# Patient Record
Sex: Female | Born: 1985 | Race: White | Hispanic: No | Marital: Married | Smoking: Never smoker
Health system: Southern US, Community
[De-identification: ages and names within clinical notes are randomized; demographics above are authoritative.]

## PROBLEM LIST (undated history)

## (undated) DIAGNOSIS — N029 Recurrent and persistent hematuria with unspecified morphologic changes: Secondary | ICD-10-CM

## (undated) DIAGNOSIS — M1990 Unspecified osteoarthritis, unspecified site: Secondary | ICD-10-CM

## (undated) DIAGNOSIS — K121 Other forms of stomatitis: Secondary | ICD-10-CM

## (undated) DIAGNOSIS — G4730 Sleep apnea, unspecified: Secondary | ICD-10-CM

## (undated) DIAGNOSIS — F419 Anxiety disorder, unspecified: Secondary | ICD-10-CM

## (undated) DIAGNOSIS — K602 Anal fissure, unspecified: Secondary | ICD-10-CM

## (undated) DIAGNOSIS — D649 Anemia, unspecified: Secondary | ICD-10-CM

## (undated) DIAGNOSIS — J45909 Unspecified asthma, uncomplicated: Secondary | ICD-10-CM

## (undated) DIAGNOSIS — K219 Gastro-esophageal reflux disease without esophagitis: Secondary | ICD-10-CM

## (undated) DIAGNOSIS — T7840XA Allergy, unspecified, initial encounter: Secondary | ICD-10-CM

## (undated) HISTORY — DX: Other forms of stomatitis: K12.1

## (undated) HISTORY — DX: Unspecified osteoarthritis, unspecified site: M19.90

## (undated) HISTORY — DX: Recurrent and persistent hematuria with unspecified morphologic changes: N02.9

## (undated) HISTORY — DX: Anal fissure, unspecified: K60.2

## (undated) HISTORY — DX: Gastro-esophageal reflux disease without esophagitis: K21.9

## (undated) HISTORY — DX: Unspecified asthma, uncomplicated: J45.909

## (undated) HISTORY — DX: Anxiety disorder, unspecified: F41.9

## (undated) HISTORY — DX: Anemia, unspecified: D64.9

## (undated) HISTORY — DX: Allergy, unspecified, initial encounter: T78.40XA

---
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
## 2006-07-01 ENCOUNTER — Other Ambulatory Visit: Admission: RE | Admit: 2006-07-01 | Discharge: 2006-07-01 | Payer: Self-pay | Admitting: Gynecology

## 2007-08-04 ENCOUNTER — Other Ambulatory Visit: Admission: RE | Admit: 2007-08-04 | Discharge: 2007-08-04 | Payer: Self-pay | Admitting: Gynecology

## 2011-04-06 ENCOUNTER — Encounter (INDEPENDENT_AMBULATORY_CARE_PROVIDER_SITE_OTHER): Payer: Self-pay | Admitting: Family Medicine

## 2011-04-06 DIAGNOSIS — R319 Hematuria, unspecified: Secondary | ICD-10-CM

## 2011-04-06 DIAGNOSIS — Z0000 Encounter for general adult medical examination without abnormal findings: Secondary | ICD-10-CM

## 2011-04-06 DIAGNOSIS — Z23 Encounter for immunization: Secondary | ICD-10-CM

## 2011-04-06 LAB — LIPID PANEL
Cholesterol: 195 mg/dL (ref 0–200)
HDL: 51 mg/dL (ref 35–70)
LDL Cholesterol: 126 mg/dL
Triglycerides: 92 mg/dL (ref 40–160)

## 2011-04-06 LAB — TSH: TSH: 1.72 u[IU]/mL (ref <=5.90)

## 2011-04-10 ENCOUNTER — Other Ambulatory Visit: Payer: Self-pay | Admitting: Family Medicine

## 2011-04-14 ENCOUNTER — Ambulatory Visit
Admission: RE | Admit: 2011-04-14 | Discharge: 2011-04-14 | Disposition: A | Discharge status: Home / Self Care | Payer: BC Managed Care – PPO | Source: Ambulatory Visit | Attending: Family Medicine | Admitting: Family Medicine

## 2011-06-04 ENCOUNTER — Telehealth: Payer: Self-pay | Admitting: Internal Medicine

## 2011-06-04 MED ORDER — ALPRAZOLAM 0.25 MG PO TABS
0.2500 mg | ORAL_TABLET | Freq: Two times a day (BID) | ORAL | Status: AC | PRN
Start: 2011-06-04 — End: 2011-07-04

## 2011-06-04 NOTE — Telephone Encounter (Signed)
RF

## 2011-06-08 ENCOUNTER — Ambulatory Visit (INDEPENDENT_AMBULATORY_CARE_PROVIDER_SITE_OTHER): Payer: BC Managed Care – PPO | Admitting: Family Medicine

## 2011-06-08 ENCOUNTER — Encounter: Payer: Self-pay | Admitting: Family Medicine

## 2011-06-08 VITALS — BP 118/75 | HR 76 | Temp 99.2°F | Resp 16 | Ht 59.0 in | Wt 148.4 lb

## 2011-06-08 DIAGNOSIS — F411 Generalized anxiety disorder: Secondary | ICD-10-CM

## 2011-06-08 DIAGNOSIS — F988 Other specified behavioral and emotional disorders with onset usually occurring in childhood and adolescence: Secondary | ICD-10-CM

## 2011-06-08 DIAGNOSIS — K121 Other forms of stomatitis: Secondary | ICD-10-CM | POA: Insufficient documentation

## 2011-06-08 DIAGNOSIS — Z658 Other specified problems related to psychosocial circumstances: Secondary | ICD-10-CM

## 2011-06-08 DIAGNOSIS — K602 Anal fissure, unspecified: Secondary | ICD-10-CM | POA: Insufficient documentation

## 2011-06-08 DIAGNOSIS — F419 Anxiety disorder, unspecified: Secondary | ICD-10-CM | POA: Insufficient documentation

## 2011-06-08 DIAGNOSIS — N029 Recurrent and persistent hematuria with unspecified morphologic changes: Secondary | ICD-10-CM | POA: Insufficient documentation

## 2011-06-08 MED ORDER — ESCITALOPRAM OXALATE 10 MG PO TABS: 10.0000 mg | ORAL_TABLET | Freq: Every day | ORAL | Status: DC

## 2011-06-08 MED ORDER — CLONAZEPAM 0.5 MG PO TABS: ORAL_TABLET | ORAL | Status: DC

## 2011-06-08 NOTE — Progress Notes (Signed)
  Subjective:    Patient ID: Donna Spencer, female    DOB: 04/27/1986, 26 y.o.   MRN: [id number]  HPI  Patient presents complaining of significant anxiety with the recent death of her grandfather. She has started a new semester at law and is planning her wedding which is scheduled for July.  FH of depression and anxiety(Father is on Lexapro and Wellbutrin)  Seeing counselor Thea Silversmith who referred patient for medication therapy. Patient scheduled an appointment with Dr. Evelene Croon but is unable to see her until March 17th.  Review of Systems     Objective:   Physical Exam  Constitutional: She appears well-developed and well-nourished.  Neurological: She is alert.  Psychiatric: She has a normal mood and affect.          Assessment & Plan:   1. GAD (generalized anxiety disorder)   2. Psychosocial stressors     Lexapro 10mg  daily Klonipin .5 mg 1/2-1 BID prn Xanax .25mg  prn panic attacks Keep appoinment with Dr. Evelene Croon Hospice refferal  Continue counseling with Selina Cooley

## 2011-06-09 ENCOUNTER — Telehealth: Payer: Self-pay

## 2011-06-09 NOTE — Telephone Encounter (Signed)
Pt CB and wants to double check with Dr Hal Hope that it OK for her to take the Lexapro Rxd at OV along with the phentermine she is already taking. She read in the Rx info sheet that the interaction might cause serotonin syndrome, and also, when she took it last night, she couldn't sleep and her heart was "beating really hard". She just wanted to make sure this is OK.

## 2011-06-09 NOTE — Telephone Encounter (Signed)
LMOM for patient to call back with concerns re: meds.

## 2011-06-09 NOTE — Telephone Encounter (Signed)
Pt is wondering if she is taking the right kind of combination of meds she is a little worried because of things she has heard would like a nurse to call her about this

## 2011-06-10 NOTE — Telephone Encounter (Signed)
Spoke with patient.  Monday evening patient was very anxious about starting the lexapro and felt the medication caused palpitations and insomnia. Patient was simultaneously taking Phentermine with the lexapro and I strongly encouraged patient against taking both.  Will see her back in clinic in 2 weeks.  Lots of reassurance provided.

## 2011-06-20 ENCOUNTER — Encounter: Payer: Self-pay | Admitting: Family Medicine

## 2011-06-22 ENCOUNTER — Ambulatory Visit (INDEPENDENT_AMBULATORY_CARE_PROVIDER_SITE_OTHER): Payer: BC Managed Care – PPO | Admitting: Family Medicine

## 2011-06-22 ENCOUNTER — Encounter: Payer: Self-pay | Admitting: Family Medicine

## 2011-06-22 DIAGNOSIS — F411 Generalized anxiety disorder: Secondary | ICD-10-CM

## 2011-06-22 DIAGNOSIS — N029 Recurrent and persistent hematuria with unspecified morphologic changes: Secondary | ICD-10-CM

## 2011-06-22 DIAGNOSIS — F419 Anxiety disorder, unspecified: Secondary | ICD-10-CM

## 2011-06-22 DIAGNOSIS — D239 Other benign neoplasm of skin, unspecified: Secondary | ICD-10-CM

## 2011-06-22 DIAGNOSIS — N938 Other specified abnormal uterine and vaginal bleeding: Secondary | ICD-10-CM

## 2011-06-22 DIAGNOSIS — K602 Anal fissure, unspecified: Secondary | ICD-10-CM

## 2011-06-22 NOTE — Progress Notes (Signed)
  Subjective:    Patient ID: Donna Spencer, female    DOB: 01-27-1986, 26 y.o.   MRN: [id number]  HPI Patient presents for follow up of anxiety. Tolerating Lexapro without side effects. Patient feels medication is beginning to help as she experienced a significant stressor over the weekend and handled it quite well.   Has only taken Xanax twice as she as she is anxious about taking too much medication.  Started menses after starting on Lexapro and wondered if it was secondary to the medication.  Rectal bleeding; history of fissure; continues to have symptoms; CBC, ESR and TSH are all normal.  Patient would like me to look at lesion on (L) LE; has not changed in size or color.   Review of Systems     Objective:   Physical Exam  Constitutional: She appears well-developed and well-nourished.  HENT:  Head: Normocephalic and atraumatic.  Cardiovascular: Normal rate, regular rhythm and normal heart sounds.   Pulmonary/Chest: Effort normal and breath sounds normal.  Neurological: She is alert.          Assessment & Plan:   1. Anxiety  Continue current medications; counseling provided  2. Anal fissure  Ambulatory referral to Gastroenterology  3. Benign hematuria  Follow  4. Dermatofibroma  reassurance  5. DUB (dysfunctional uterine bleeding)  Menstrual calendar

## 2011-06-29 ENCOUNTER — Encounter: Payer: Self-pay | Admitting: Gastroenterology

## 2011-07-09 ENCOUNTER — Ambulatory Visit: Payer: BC Managed Care – PPO | Admitting: Gastroenterology

## 2011-08-03 ENCOUNTER — Ambulatory Visit: Payer: BC Managed Care – PPO | Admitting: Family Medicine

## 2011-11-10 ENCOUNTER — Other Ambulatory Visit: Payer: Self-pay | Admitting: Family Medicine

## 2012-01-26 ENCOUNTER — Ambulatory Visit: Payer: Self-pay | Admitting: Physician Assistant

## 2012-01-26 VITALS — BP 97/65 | HR 95 | Temp 98.9°F | Resp 17 | Ht 58.5 in | Wt 144.0 lb

## 2012-01-26 DIAGNOSIS — N912 Amenorrhea, unspecified: Secondary | ICD-10-CM

## 2012-01-26 DIAGNOSIS — L03119 Cellulitis of unspecified part of limb: Secondary | ICD-10-CM

## 2012-01-26 DIAGNOSIS — L02419 Cutaneous abscess of limb, unspecified: Secondary | ICD-10-CM

## 2012-01-26 LAB — POCT URINE PREGNANCY: Preg Test, Ur: NEGATIVE

## 2012-01-26 MED ORDER — DOXYCYCLINE HYCLATE 100 MG PO CAPS: 100.0000 mg | ORAL_CAPSULE | Freq: Two times a day (BID) | ORAL | Status: DC

## 2012-01-26 MED ORDER — FLUCONAZOLE 150 MG PO TABS: 150.0000 mg | ORAL_TABLET | Freq: Once | ORAL | Status: DC

## 2012-01-26 NOTE — Progress Notes (Signed)
  Subjective:    Patient ID: Donna Spencer, female    DOB: June 15, 1985, 26 y.o.   MRN: [id number]  HPI L lower leg with abscess for about 1 month.  She has been putting neosporin on it.  She had a similar abscess on her R lower leg 1 year ago.  She never went to the doctor for it and she drained it herself.  No f/c.  No known bite. Only painful at times.   She also mentions having skipped her period.  She has taken home pregnancy tests that were negative, but she would like to take one here.   Review of Systems  All other systems reviewed and are negative.       Objective:   Physical Exam  Nursing note and vitals reviewed. Constitutional: She is oriented to person, place, and time. She appears well-developed and well-nourished.  HENT:  Head: Normocephalic and atraumatic.  Cardiovascular: Normal rate.   Pulmonary/Chest: Effort normal.  Neurological: She is alert and oriented to person, place, and time.  Skin:       Left lower leg ~ 7cm above medial malleolus 2X3 cm area of erythema, there is no induration.  There is a small area of central fluctuance ~3mm. No purulent drainage.   Results for orders placed in visit on 01/26/12  POCT URINE PREGNANCY      Component Value Range   Preg Test, Ur Negative        Assessment & Plan:  Mild cellulitis-I think this will get better with salt water soaks and the antibiotics.  D/c Neosporin.  Bandaged and ace wrapped. Amenorrhea-negative pregnancy test.

## 2012-03-10 ENCOUNTER — Ambulatory Visit (INDEPENDENT_AMBULATORY_CARE_PROVIDER_SITE_OTHER): Payer: BC Managed Care – PPO | Admitting: Emergency Medicine

## 2012-03-10 VITALS — BP 104/64 | HR 93 | Temp 98.0°F | Resp 16 | Ht 59.0 in | Wt 145.2 lb

## 2012-03-10 DIAGNOSIS — J209 Acute bronchitis, unspecified: Secondary | ICD-10-CM

## 2012-03-10 DIAGNOSIS — J40 Bronchitis, not specified as acute or chronic: Secondary | ICD-10-CM

## 2012-03-10 DIAGNOSIS — S81009A Unspecified open wound, unspecified knee, initial encounter: Secondary | ICD-10-CM

## 2012-03-10 DIAGNOSIS — L089 Local infection of the skin and subcutaneous tissue, unspecified: Secondary | ICD-10-CM

## 2012-03-10 DIAGNOSIS — R059 Cough, unspecified: Secondary | ICD-10-CM

## 2012-03-10 DIAGNOSIS — L97919 Non-pressure chronic ulcer of unspecified part of right lower leg with unspecified severity: Secondary | ICD-10-CM

## 2012-03-10 DIAGNOSIS — S81809A Unspecified open wound, unspecified lower leg, initial encounter: Secondary | ICD-10-CM

## 2012-03-10 DIAGNOSIS — R05 Cough: Secondary | ICD-10-CM

## 2012-03-10 MED ORDER — MUPIROCIN 2 % EX OINT: TOPICAL_OINTMENT | Freq: Three times a day (TID) | CUTANEOUS | Status: DC

## 2012-03-10 MED ORDER — DOXYCYCLINE HYCLATE 100 MG PO CAPS: 100.0000 mg | ORAL_CAPSULE | Freq: Two times a day (BID) | ORAL | Status: DC

## 2012-03-10 MED ORDER — BENZONATATE 100 MG PO CAPS: 100.0000 mg | ORAL_CAPSULE | Freq: Three times a day (TID) | ORAL | Status: DC | PRN

## 2012-03-10 NOTE — Progress Notes (Signed)
  Subjective:    Patient ID: Demetra Shiner, female    DOB: 1985-06-04, 26 y.o.   MRN: [id number]  HPI Pt is a 26 year old female here for cough with phlegm. Cough getting worse. She doesn't smoke, no allergies, but has some nasal congestion. She is also here for a left leg infection, which she was treated here and was given doxycycline about a month ago. She states it has came back and has busted with some drainage. She has been keeping the wound covered with a bandage but not having any pain.;    Review of Systems     Objective:   Physical Exam HEENT exam TMs are clear nose is congested but no true.. Throat is red. Neck is supple. Chest exam reveals no rhonchi no wheezes no rales.  Examination of left leg reveals a 3 x 4 mm ulcer which approximately a centimeter of surrounding bluish discolored skin. There is no purulent drainage      Assessment & Plan:  Patient has signs and symptoms of a bronchitis. We will treat with doxycycline 100 twice a day. I did a culture of the wound on her leg but I think this is more likely a resolving bout loose body. I did give her Bactroban ointment applied to the ulceration she has on her shin.

## 2012-03-10 NOTE — Patient Instructions (Addendum)

## 2012-03-13 LAB — WOUND CULTURE

## 2012-03-13 MED ORDER — CEPHALEXIN 500 MG PO CAPS: 500.0000 mg | ORAL_CAPSULE | Freq: Two times a day (BID) | ORAL | Status: DC

## 2012-03-13 NOTE — Addendum Note (Signed)
Addended by: Johnnette Litter on: 03/13/2012 01:34 PM   Modules accepted: Orders

## 2012-06-11 ENCOUNTER — Other Ambulatory Visit: Payer: Self-pay

## 2012-12-27 ENCOUNTER — Ambulatory Visit (HOSPITAL_COMMUNITY)
Admission: RE | Admit: 2012-12-27 | Discharge: 2012-12-27 | Disposition: A | Discharge status: Home / Self Care | Payer: BC Managed Care – PPO | Source: Ambulatory Visit | Attending: Orthopaedic Surgery | Admitting: Orthopaedic Surgery

## 2012-12-27 ENCOUNTER — Other Ambulatory Visit (HOSPITAL_COMMUNITY): Payer: Self-pay | Admitting: Orthopaedic Surgery

## 2012-12-27 DIAGNOSIS — M25561 Pain in right knee: Secondary | ICD-10-CM

## 2012-12-27 DIAGNOSIS — M79609 Pain in unspecified limb: Secondary | ICD-10-CM

## 2012-12-27 DIAGNOSIS — M7989 Other specified soft tissue disorders: Secondary | ICD-10-CM

## 2012-12-27 NOTE — Progress Notes (Signed)
*  Preliminary Results* Right lower extremity venous duplex completed. Right lower extremity is negative for deep vein thrombosis. There is no evidence of right Baker's cyst.  12/27/2012 3:13 PM  Gertie Fey, RVT, RDCS, RDMS

## 2013-03-02 ENCOUNTER — Other Ambulatory Visit: Payer: Self-pay

## 2014-01-26 ENCOUNTER — Ambulatory Visit (INDEPENDENT_AMBULATORY_CARE_PROVIDER_SITE_OTHER): Payer: Managed Care, Other (non HMO) | Admitting: Family Medicine

## 2014-01-26 ENCOUNTER — Encounter: Payer: Self-pay | Admitting: Family Medicine

## 2014-01-26 VITALS — BP 114/73 | HR 80 | Temp 98.1°F | Resp 16 | Ht 59.25 in | Wt 194.2 lb

## 2014-01-26 DIAGNOSIS — Z01419 Encounter for gynecological examination (general) (routine) without abnormal findings: Secondary | ICD-10-CM

## 2014-01-26 DIAGNOSIS — Z1321 Encounter for screening for nutritional disorder: Secondary | ICD-10-CM

## 2014-01-26 DIAGNOSIS — N898 Other specified noninflammatory disorders of vagina: Secondary | ICD-10-CM

## 2014-01-26 DIAGNOSIS — Z13220 Encounter for screening for lipoid disorders: Secondary | ICD-10-CM

## 2014-01-26 DIAGNOSIS — Z309 Encounter for contraceptive management, unspecified: Principal | ICD-10-CM

## 2014-01-26 DIAGNOSIS — E669 Obesity, unspecified: Secondary | ICD-10-CM

## 2014-01-26 DIAGNOSIS — Z3041 Encounter for surveillance of contraceptive pills: Secondary | ICD-10-CM | POA: Insufficient documentation

## 2014-01-26 DIAGNOSIS — Z139 Encounter for screening, unspecified: Secondary | ICD-10-CM

## 2014-01-26 DIAGNOSIS — Z0000 Encounter for general adult medical examination without abnormal findings: Secondary | ICD-10-CM

## 2014-01-26 LAB — POCT WET PREP WITH KOH
KOH Prep POC: NEGATIVE
Trichomonas, UA: NEGATIVE
Yeast Wet Prep HPF POC: NEGATIVE

## 2014-01-26 LAB — COMPLETE METABOLIC PANEL WITH GFR
ALBUMIN: 4 g/dL (ref 3.5–5.2)
ALT: 13 U/L (ref 0–35)
AST: 18 U/L (ref 0–37)
Alkaline Phosphatase: 57 U/L (ref 39–117)
BILIRUBIN TOTAL: 0.3 mg/dL (ref 0.2–1.2)
BUN: 8 mg/dL (ref 6–23)
CHLORIDE: 101 meq/L (ref 96–112)
CO2: 25 meq/L (ref 19–32)
Calcium: 9.2 mg/dL (ref 8.4–10.5)
Creat: 0.62 mg/dL (ref 0.50–1.10)
GFR, Est Non African American: >89 mL/min
Glucose, Bld: 71 mg/dL (ref 70–99)
POTASSIUM: 4.4 meq/L (ref 3.5–5.3)
SODIUM: 138 meq/L (ref 135–145)
TOTAL PROTEIN: 7.3 g/dL (ref 6.0–8.3)

## 2014-01-26 LAB — THYROID PANEL WITH TSH
Free Thyroxine Index: 2 (ref 1.4–3.8)
T3 UPTAKE: 22 % (ref 22.0–35.0)
T4, Total: 9 ug/dL (ref 4.5–12.0)
TSH: 1.592 u[IU]/mL (ref 0.350–4.500)

## 2014-01-26 LAB — LIPID PANEL
Cholesterol: 215 mg/dL — ABNORMAL HIGH (ref 0–200)
HDL: 69 mg/dL (ref >39)
LDL Cholesterol: 120 mg/dL — ABNORMAL HIGH (ref 0–99)
Total CHOL/HDL Ratio: 3.1 Ratio
Triglycerides: 131 mg/dL (ref <150)
VLDL: 26 mg/dL (ref 0–40)

## 2014-01-26 MED ORDER — NORGESTIM-ETH ESTRAD TRIPHASIC 0.18/0.215/0.25 MG-25 MCG PO TABS: 1.0000 | ORAL_TABLET | Freq: Every day | ORAL | Status: DC

## 2014-01-26 MED ORDER — METRONIDAZOLE 500 MG PO TABS: 500.0000 mg | ORAL_TABLET | Freq: Two times a day (BID) | ORAL | Status: DC

## 2014-01-26 NOTE — Progress Notes (Signed)
Subjective:    Patient ID: Donna Spencer, female    DOB: [birth year], 28 y.o.   MRN: [id number]  HPI  This 28 y.o. Cauc female is here for CPE/PAP. No recent PAP but last one was negative. She has concerns about weight gain; having gained ~ 50 lbs in last 12-15 months as result of stress eating while studying for the [employer]. No regular exercise.  She passed on 2nd attempt. Her mother is attorney Winona Legato.  Patient Active Problem List   Diagnosis Date Noted  . Obesity 01/26/2014  . Uses oral contraceptives 01/26/2014  . Anxiety   . Anal fissure   . Benign hematuria   . Mouth ulcers     Prior to Admission medications   Medication Sig Start Date End Date Taking? Authorizing Provider  amphetamine-dextroamphetamine (ADDERALL XR) 30 MG 24 hr capsule Take 30 mg by mouth every morning.   Yes Historical Provider, MD  desvenlafaxine (PRISTIQ) 100 MG 24 hr tablet Take 100 mg by mouth daily.   Yes Historical Provider, MD  Norgestimate-Ethinyl Estradiol Triphasic (ORTHO TRI-CYCLEN LO) 0.18/0.215/0.25 MG-25 MCG tab Take 1 tablet by mouth daily.   Yes Barton Fanny, MD  benzonatate (TESSALON) 100 MG capsule Take 1-2 capsules (100-200 mg total) by mouth 3 (three) times daily as needed for cough. 03/10/12   Darlyne Russian, MD  escitalopram (LEXAPRO) 20 MG tablet Take 30 mg by mouth daily. Pt taking 1 1/2 tablets qd    Historical Provider, MD  mupirocin ointment (BACTROBAN) 2 % Apply topically 3 (three) times daily. 03/10/12   Darlyne Russian, MD    History   Social History  . Marital Status: Single    Spouse Name: N/A    Number of Children: N/A  . Years of Education: N/A   Occupational History  . 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 Smoking status: Never Smoker   . Smokeless tobacco: Not on file  . Alcohol Use: No     Comment: occasionally - couple times/month (liquor)  . Drug Use: No  . Sexual Activity: Yes    Birth Control/ Protection: Pill   Other  Topics Concern  . Not on file   Social History Narrative  . No narrative on file    Family History  Problem Relation Age of Onset  . Hypertension Mother   . Cancer Maternal Grandmother   . GER disease Paternal Grandmother   . COPD Paternal Grandfather     Review of Systems  Constitutional: Positive for unexpected weight change. Negative for fatigue.  HENT: Negative.   Eyes: Negative.        Wears corrective lenses.  Respiratory: Negative.   Cardiovascular: Negative.   Gastrointestinal: Negative.   Endocrine: Negative.   Genitourinary: Negative.        "I don't really have regular periods". Occasional breast pain- consumes a lot of tea/caffeine.  Musculoskeletal: Negative.   Skin: Negative.   Allergic/Immunologic: Negative.   Neurological: Negative.   Hematological: Negative.   Psychiatric/Behavioral: Negative.        Objective:   Physical Exam  Nursing note and vitals reviewed. Constitutional: She is oriented to person, place, and time. Vital signs are normal. She appears well-developed and well-nourished.  HENT:  Head: Normocephalic and atraumatic.  Right Ear: Hearing, external ear and ear canal normal. Tympanic membrane is erythematous. Tympanic membrane is not injected and not retracted. No middle ear effusion.  Left Ear: Hearing, external ear  and ear canal normal. Tympanic membrane is erythematous. Tympanic membrane is not injected and not retracted.  No middle ear effusion.  Nose: Nose normal. No nasal deformity or septal deviation.  Mouth/Throat: Uvula is midline, oropharynx is clear and moist and mucous membranes are normal. No oral lesions. Normal dentition. No dental caries.  Eyes: Conjunctivae, EOM and lids are normal. Pupils are equal, round, and reactive to light. No scleral icterus.  Fundoscopic exam:      The right eye shows red reflex.       The left eye shows red reflex.  Neck: Trachea normal, normal range of motion, full passive range of motion  without pain and phonation normal. Neck supple. No spinous process tenderness and no muscular tenderness present. No mass and no thyromegaly present.  Cardiovascular: Normal rate, regular rhythm, S1 normal, S2 normal, normal heart sounds and normal pulses.  PMI is not displaced.   No murmur heard. Pulmonary/Chest: Effort normal and breath sounds normal. No respiratory distress. Right breast exhibits no inverted nipple, no mass, no nipple discharge, no skin change and no tenderness. Left breast exhibits no inverted nipple, no mass, no nipple discharge, no skin change and no tenderness. Breasts are symmetrical.  Abdominal: Soft. Normal appearance and bowel sounds are normal. She exhibits no distension and no mass. There is no hepatosplenomegaly. There is no tenderness. There is no guarding and no CVA tenderness.  Genitourinary: Rectum normal and uterus normal. There is no rash, tenderness or lesion on the right labia. There is no rash, tenderness or lesion on the left labia. Cervix exhibits discharge and friability. Cervix exhibits no motion tenderness. Right adnexum displays no mass, no tenderness and no fullness. Left adnexum displays no mass, no tenderness and no fullness. There is erythema around the vagina. No tenderness or bleeding around the vagina. Vaginal discharge found.  Musculoskeletal: Normal range of motion. She exhibits no edema and no tenderness.       Cervical back: Normal.       Thoracic back: Normal.       Lumbar back: Normal.  Lymphadenopathy:       Head (right side): No submental, no submandibular, no tonsillar, no preauricular, no posterior auricular and no occipital adenopathy present.       Head (left side): No submental, no submandibular, no tonsillar, no preauricular, no posterior auricular and no occipital adenopathy present.    She has no cervical adenopathy.    She has no axillary adenopathy.       Right: No inguinal and no supraclavicular adenopathy present.       Left: No  inguinal and no supraclavicular adenopathy present.  Neurological: She is alert and oriented to person, place, and time. She has normal strength and normal reflexes. She displays no atrophy. No cranial nerve deficit or sensory deficit. She exhibits normal muscle tone. Coordination and gait normal.  Skin: Skin is warm, dry and intact. No ecchymosis, no lesion, no petechiae and no rash noted. She is not diaphoretic. No cyanosis or erythema. No pallor. Nails show no clubbing.  Psychiatric: She has a normal mood and affect. Her speech is normal and behavior is normal. Judgment and thought content normal. Cognition and memory are normal.    Results for orders placed in visit on 01/26/14  POCT WET PREP WITH KOH      Result Value Ref Range   Trichomonas, UA Negative     Clue Cells Wet Prep HPF POC 7-14     Epithelial Wet Prep  HPF POC 7-14     Yeast Wet Prep HPF POC neg     Bacteria Wet Prep HPF POC 3+     RBC Wet Prep HPF POC 4-6     WBC Wet Prep HPF POC tntc     KOH Prep POC Negative         Assessment & Plan:  Normal physical examination, routine  Encounter for routine gynecological examination - Plan: Pap IG, CT/NG w/ reflex HPV when ASC-U  Encounter for vitamin deficiency screening - Plan: Vitamin D, 25-hydroxy  Vaginal discharge - Bacterial vaginosis > RX: Metronidazole 500 mg 1 tab bid.  Plan: POCT Wet Prep with KOH  Screening for hyperlipidemia - Plan: Lipid panel  Screening - Plan: CBC with diff  Obesity - Advised healthy nutrition and regular fitness routine. Pt will contact [facility] for nutritional guidance and weight management. Plan: COMPLETE METABOLIC PANEL WITH GFR, Vitamin D, 25-hydroxy, Thyroid Panel With TSH  Uses oral contraceptives- Refill OCPs  Meds ordered this encounter  Medications  . Norgestimate-Ethinyl Estradiol Triphasic (ORTHO TRI-CYCLEN LO) 0.18/0.215/0.25 MG-25 MCG tab    Sig: Take 1 tablet by mouth daily.    Dispense:  3 Package     Refill:  3  . metroNIDAZOLE (FLAGYL) 500 MG tablet    Sig: Take 1 tablet (500 mg total) by mouth 2 (two) times daily.    Dispense:  14 tablet    Refill:  0

## 2014-01-26 NOTE — Patient Instructions (Addendum)
Keeping You Healthy  Get These Tests 1. Blood Pressure- Have your blood pressure checked once a year by your health care provider.  Normal blood pressure is 120/80. 2. Weight- Have your body mass index (BMI) calculated to screen for obesity.  BMI is measure of body fat based on height and weight.  You can also calculate your own BMI at GravelBags.it. 3. Cholesterol- Have your cholesterol checked every 5 years starting at age 27 then yearly starting at age 59. 42. Chlamydia, HIV, and other sexually transmitted diseases- Get screened every year until age 65, then within three months of each new sexual provider. 5. Pap Smear- Every 1-3 years; discuss with your health care provider. 6. Mammogram- Every year starting at age 8  Take these medicines  Calcium with Vitamin D-Your body needs 1200 mg of Calcium each day and [phone] IU of Vitamin D daily.  Your body can only absorb 500 mg of Calcium at a time so Calcium must be taken in 2 or 3 divided doses throughout the day.  Multivitamin with folic acid- Once daily if it is possible for you to become pregnant.  Get these Immunizations  Menactra-Single dose; prevents meningitis.  Tetanus shot- Every 10 years.  Tdap- 04/06/2011.  Flu shot-Every year.  Take these steps 1. Do not smoke-Your healthcare provider can help you quit.  For tips on how to quit go to www.smokefree.gov or call 1-800 QUITNOW. 2. Be physically active- Exercise 5 days a week for at least 30 minutes.  If you are not already physically active, start slow and gradually work up to 30 minutes of moderate physical activity.  Examples of moderate activity include walking briskly, dancing, swimming, bicycling, etc. 3. Breast Cancer- A self breast exam every month is important for early detection of breast cancer.  For more information and instruction on self breast exams, ask your healthcare provider or https://www.patel.info/. 4. Eat a healthy diet-  Eat a variety of healthy foods such as fruits, vegetables, whole grains, low fat milk, low fat cheeses, yogurt, lean meats, poultry and fish, beans, nuts, tofu, etc.  For more information go to www. Thenutritionsource.org 5. Drink alcohol in moderation- Limit alcohol intake to one drink or less per day. Never drink and drive. 6. Depression- Your emotional health is as important as your physical health.  If you're feeling down or losing interest in things you normally enjoy please talk to your healthcare provider about being screened for depression. 7. Dental visit- Brush and floss your teeth twice daily; visit your dentist twice a year. 8. Eye doctor- Get an eye exam at least every 2 years. 9. Helmet use- Always wear a helmet when riding a bicycle, motorcycle, rollerblading or skateboarding. 16. Safe sex- If you may be exposed to sexually transmitted infections, use a condom. 11. Seat belts- Seat belts can save your live; always wear one. 12. Smoke/Carbon Monoxide detectors- These detectors need to be installed on the appropriate level of your home. Replace batteries at least once a year. 13. Skin cancer- When out in the sun please cover up and use sunscreen 15 SPF or higher. 14. Violence- If anyone is threatening or hurting you, please tell your healthcare provider.       Mediterranean Diet  Why follow it? Research shows.   Those who follow the Mediterranean diet have a reduced risk of heart disease    The diet is associated with a reduced incidence of Parkinson's and Alzheimer's diseases   People following the diet may have  longer life expectancies and lower rates of chronic diseases    The Dietary Guidelines for Americans recommends the Mediterranean diet as an eating plan to promote health and prevent disease  What Is the Mediterranean Diet?    Healthy eating plan based on typical foods and recipes of Mediterranean-style cooking   The diet is primarily a plant based diet; these foods  should make up a majority of meals   Starches - Plant based foods should make up a majority of meals - They are an important sources of vitamins, minerals, energy, antioxidants, and fiber - Choose whole grains, foods high in fiber and minimally processed items  - Typical grain sources include wheat, oats, barley, corn, brown rice, bulgar, farro, millet, polenta, couscous  - Various types of beans include chickpeas, lentils, fava beans, black beans, white beans   Fruits  Veggies - Large quantities of antioxidant rich fruits & veggies; 6 or more servings  - Vegetables can be eaten raw or lightly drizzled with oil and cooked  - Vegetables common to the traditional Mediterranean Diet include: artichokes, arugula, beets, broccoli, brussel sprouts, cabbage, carrots, celery, collard greens, cucumbers, eggplant, kale, leeks, lemons, lettuce, mushrooms, okra, onions, peas, peppers, potatoes, pumpkin, radishes, rutabaga, shallots, spinach, sweet potatoes, turnips, zucchini - Fruits common to the Mediterranean Diet include: apples, apricots, avocados, cherries, clementines, dates, figs, grapefruits, grapes, melons, nectarines, oranges, peaches, pears, pomegranates, strawberries, tangerines  Fats - Replace butter and margarine with healthy oils, such as olive oil, canola oil, and tahini  - Limit nuts to no more than a handful a day  - Nuts include walnuts, almonds, pecans, pistachios, pine nuts  - Limit or avoid candied, honey roasted or heavily salted nuts - Olives are central to the [employer] - can be eaten whole or used in a variety of dishes   Meats Protein - Limiting red meat: no more than a few times a month - When eating red meat: choose lean cuts and keep the portion to the size of deck of cards - Eggs: approx. 0 to 4 times a week  - Fish and lean poultry: at least 2 a week  - Healthy protein sources include, chicken, [place], lean beef, lamb - Increase intake of seafood such as tuna,  salmon, trout, mackerel, shrimp, scallops - Avoid or limit high fat processed meats such as sausage and bacon  Dairy - Include moderate amounts of low fat dairy products  - Focus on healthy dairy such as fat free yogurt, skim milk, low or reduced fat cheese - Limit dairy products higher in fat such as whole or 2% milk, cheese, ice cream  Alcohol - Moderate amounts of red wine is ok  - No more than 5 oz daily for women (all ages) and men older than age 14  - No more than 10 oz of wine daily for men younger than 70  Other - Limit sweets and other desserts  - Use herbs and spices instead of salt to flavor foods  - Herbs and spices common to the traditional Mediterranean Diet include: basil, bay leaves, chives, cloves, cumin, fennel, garlic, lavender, marjoram, mint, oregano, parsley, pepper, rosemary, sage, savory, sumac, tarragon, thyme   It's not just a diet, it's a lifestyle:    The Mediterranean diet includes lifestyle factors typical of those in the region    Foods, drinks and meals are best eaten with others and savored   Daily physical activity is important for overall good health   This  could be strenuous exercise like running and aerobics   This could also be more leisurely activities such as walking, housework, yard-work, or taking the stairs   Moderation is the key; a balanced and healthy diet accommodates most foods and drinks   Consider portion sizes and frequency of consumption of certain foods   Meal Ideas & Options:    Breakfast:  o Whole wheat toast or whole wheat English muffins with peanut butter & hard boiled egg o Steel cut oats topped with apples & cinnamon and skim milk  o Fresh fruit: banana, strawberries, melon, berries, peaches  o Smoothies: strawberries, bananas, greek yogurt, peanut butter o Low fat greek yogurt with blueberries and granola  o Egg white omelet with spinach and mushrooms o Breakfast couscous: whole wheat couscous, apricots, skim milk, cranberries     Sandwiches:  o Hummus and grilled vegetables (peppers, zucchini, squash) on whole wheat bread   o Grilled chicken on whole wheat pita with lettuce, tomatoes, cucumbers or tzatziki  o Tuna salad on whole wheat bread: tuna salad made with greek yogurt, olives, red peppers, capers, green onions o Garlic rosemary lamb pita: lamb sauted with garlic, rosemary, salt & pepper; add lettuce, cucumber, greek yogurt to pita - flavor with lemon juice and black pepper    Seafood:  o Mediterranean grilled salmon, seasoned with garlic, basil, parsley, lemon juice and black pepper o Shrimp, lemon, and spinach whole-grain pasta salad made with low fat greek yogurt  o Seared scallops with lemon orzo  o Seared tuna steaks seasoned salt, pepper, coriander topped with tomato mixture of olives, tomatoes, olive oil, minced garlic, parsley, green onions and cappers    Meats:  o Herbed greek chicken salad with kalamata olives, cucumber, feta  o Red bell peppers stuffed with spinach, bulgur, lean ground beef (or lentils) & topped with feta   o Kebabs: skewers of chicken, tomatoes, onions, zucchini, squash  o [place] burgers: made with red onions, mint, dill, lemon juice, feta cheese topped with roasted red peppers   Vegetarian o Cucumber salad: cucumbers, artichoke hearts, celery, red onion, feta cheese, tossed in olive oil & lemon juice  o Hummus and whole grain pita points with a greek salad (lettuce, tomato, feta, olives, cucumbers, red onion) o Lentil soup with celery, carrots made with vegetable broth, garlic, salt and pepper  o Tabouli salad: parsley, bulgur, mint, scallions, cucumbers, tomato, radishes, lemon juice, olive oil, salt and pepper. o     Try to find a physical activity that is fun so that you will commit to staying active.  Exercise to Lose Weight Exercise and a healthy diet may help you lose weight. Your doctor may suggest specific exercises. EXERCISE IDEAS AND TIPS  Choose low-cost things  you enjoy doing, such as walking, bicycling, or exercising to workout videos.  Take stairs instead of the elevator.  Walk during your lunch break.  Park your car further away from work or school.  Go to a gym or an exercise class.  Start with 5 to 10 minutes of exercise each day. Build up to 30 minutes of exercise 4 to 6 days a week.  Wear shoes with good support and comfortable clothes.  Stretch before and after working out.  Work out until you breathe harder and your heart beats faster.  Drink extra water when you exercise.  Do not do so much that you hurt yourself, feel dizzy, or get very short of breath. Exercises that burn about 150 calories:  Running 1  miles in 15 minutes.  Playing volleyball for 45 to 60 minutes.  Washing and waxing a car for 45 to 60 minutes.  Playing touch football for 45 minutes.  Walking 1  miles in 35 minutes.  Pushing a stroller 1  miles in 30 minutes.  Playing basketball for 30 minutes.  Raking leaves for 30 minutes.  Bicycling 5 miles in 30 minutes.  Walking 2 miles in 30 minutes.  Dancing for 30 minutes.  Shoveling snow for 15 minutes.  Swimming laps for 20 minutes.  Walking up stairs for 15 minutes.  Bicycling 4 miles in 15 minutes.  Gardening for 30 to 45 minutes.  Jumping rope for 15 minutes.  Washing windows or floors for 45 to 60 minutes. Document Released: 05/16/2010 Document Revised: 07/06/2011 Document Reviewed: 05/16/2010 [facility] Patient Information 2015 [place], [place]. This information is not intended to replace advice given to you by your health care provider. Make sure you discuss any questions you have with your health care provider.

## 2014-01-27 LAB — VITAMIN D 25 HYDROXY (VIT D DEFICIENCY, FRACTURES): Vit D, 25-Hydroxy: 29 ng/mL — ABNORMAL LOW (ref 30–89)

## 2014-01-29 LAB — PAP IG, CT-NG, RFX HPV ASCU
CHLAMYDIA PROBE AMP: NEGATIVE
GC Probe Amp: NEGATIVE

## 2014-01-30 ENCOUNTER — Telehealth: Payer: Self-pay | Admitting: *Deleted

## 2014-01-30 LAB — CBC WITH DIFFERENTIAL/PLATELET

## 2014-01-30 NOTE — Telephone Encounter (Signed)
[facility] called to notify us that they had an error in performing the CBC with Diff- they were not able to complete the test before the tube expired. Do you want pt to rtc to have CBC drawn again?

## 2014-01-30 NOTE — Telephone Encounter (Signed)
Pt does not need to return to clinic.

## 2014-01-30 NOTE — Telephone Encounter (Signed)
Donna Spencer

## 2014-02-02 ENCOUNTER — Telehealth: Payer: Self-pay

## 2014-02-02 ENCOUNTER — Encounter: Payer: Self-pay | Admitting: Family Medicine

## 2014-02-02 NOTE — Telephone Encounter (Signed)
The patient called regarding her medication for metroNIDAZOLE (FLAGYL) 500 MG tablet that was prescribed at her 02/01/14 appointment with Dr. Leward Quan.  She said that the medication made her very sick, and she wanted to discuss a possible alternative medication.  CB#: [phone]

## 2014-02-03 NOTE — Telephone Encounter (Signed)
Pt is done with the Flagyl. Donna Spencer it gave her N/V and fever. Wanted me to add this to her allergy list. I did.

## 2014-08-21 ENCOUNTER — Ambulatory Visit (INDEPENDENT_AMBULATORY_CARE_PROVIDER_SITE_OTHER): Payer: BLUE CROSS/BLUE SHIELD | Admitting: Emergency Medicine

## 2014-08-21 ENCOUNTER — Encounter: Payer: Self-pay | Admitting: Physician Assistant

## 2014-08-21 VITALS — BP 112/77 | HR 91 | Temp 98.5°F | Resp 16 | Ht 59.0 in | Wt 203.0 lb

## 2014-08-21 DIAGNOSIS — R05 Cough: Secondary | ICD-10-CM

## 2014-08-21 DIAGNOSIS — R1110 Vomiting, unspecified: Secondary | ICD-10-CM | POA: Diagnosis not present

## 2014-08-21 DIAGNOSIS — N912 Amenorrhea, unspecified: Secondary | ICD-10-CM

## 2014-08-21 DIAGNOSIS — R059 Cough, unspecified: Secondary | ICD-10-CM

## 2014-08-21 LAB — CBC WITH DIFFERENTIAL/PLATELET
Basophils Absolute: 0 10*3/uL (ref 0.0–0.1)
Basophils Relative: 0 % (ref 0–1)
EOS ABS: 0.3 10*3/uL (ref 0.0–0.7)
EOS PCT: 2 % (ref 0–5)
HEMATOCRIT: 39.6 % (ref 36.0–46.0)
HEMOGLOBIN: 13 g/dL (ref 12.0–15.0)
Lymphocytes Relative: 28 % (ref 12–46)
Lymphs Abs: 3.8 10*3/uL (ref 0.7–4.0)
MCH: 29.3 pg (ref 26.0–34.0)
MCHC: 32.8 g/dL (ref 30.0–36.0)
MCV: 89.4 fL (ref 78.0–100.0)
MONO ABS: 1 10*3/uL (ref 0.1–1.0)
MPV: 9 fL (ref 8.6–12.4)
Monocytes Relative: 7 % (ref 3–12)
NEUTROS PCT: 63 % (ref 43–77)
Neutro Abs: 8.6 10*3/uL — ABNORMAL HIGH (ref 1.7–7.7)
PLATELETS: 509 10*3/uL — AB (ref 150–400)
RBC: 4.43 MIL/uL (ref 3.87–5.11)
RDW: 13.6 % (ref 11.5–15.5)
WBC: 13.6 10*3/uL — ABNORMAL HIGH (ref 4.0–10.5)

## 2014-08-21 LAB — HCG, SERUM, QUALITATIVE: PREG SERUM: NEGATIVE

## 2014-08-21 LAB — POCT URINE PREGNANCY: Preg Test, Ur: NEGATIVE

## 2014-08-21 MED ORDER — BENZONATATE 100 MG PO CAPS: 100.0000 mg | ORAL_CAPSULE | Freq: Three times a day (TID) | ORAL | Status: DC | PRN

## 2014-08-21 MED ORDER — OMEPRAZOLE 40 MG PO CPDR: 40.0000 mg | DELAYED_RELEASE_CAPSULE | Freq: Every day | ORAL | Status: DC

## 2014-08-21 NOTE — Progress Notes (Addendum)
Subjective:    Patient ID: Donna Spencer, female    DOB: 1985-05-23, 29 y.o.   MRN: [id number] This chart was scribed for Arlyss Queen, MD by Donna Spencer, Medical Scribe. This patient was seen in room 27 and the patient's care was started at 3:27 PM.   HPI HPI Comments: Donna Spencer is a 29 y.o. female who presents to the [facility] complaining of gradual onset dry cough that started 2 months ago. The cough is throughout the day, but worse at night after around 5:00 PM. The coughing is not worsened by laying down. She notes 5 episodes of vomiting last night. Patient notes she did have some heartburn about a month and a half ago as well sharp chest pain for a brief period last month. She notes that her symptoms started with the flu and an infection to her inner ear. She had been put on an inhaler for her symptoms, but she notes that the inhaler makes her sick. Patient denies ear pain and wheezing. She also denies hx of asthma. She used a service called [facility] which allows her to be seen virtually by a physician on her phone. She has not had a CXR done.  Patient notes that she has not had a menstrual period since her recent illness started 2 months ago. Her last menstrual period was 2 months ago. She had been on birth control pills since the age of 24, but she recently stopped taking them 4 months ago. She has had recent pregnancy tests which have turned out negative. Patient is married and sexually active; she uses condoms every time for birth control.  Review of Systems  HENT: Negative for ear pain.   Respiratory: Positive for cough. Negative for wheezing.   Cardiovascular: Negative for chest pain.  Gastrointestinal: Positive for vomiting.  Genitourinary: Positive for menstrual problem.       Objective:   Physical Exam CONSTITUTIONAL: Well developed/well nourished HEAD: Normocephalic/atraumatic EYES: EOM/PERRL ENMT: Mucous membranes moist. Throat is  normal. Fluid behind right TM. NECK: supple no meningeal signs SPINE: entire spine nontender CV: S1/S2 noted, no murmurs/rubs/gallops noted. Regular rate and rhythm. LUNGS: Lungs are clear to auscultation bilaterally, no apparent distress ABDOMEN: soft, nontender, no rebound or guarding GU: no cva tenderness NEURO: Pt is awake/alert, moves all extremitiesx4 EXTREMITIES: pulses normal, full ROM SKIN: warm, color normal PSYCH: no abnormalities of mood noted Results for orders placed or performed in visit on 08/21/14  POCT urine pregnancy  Result Value Ref Range   Preg Test, Ur Negative    Results for orders placed or performed in visit on 08/21/14  CBC with Differential/Platelet  Result Value Ref Range   WBC 13.6 (H) 4.0 - 10.5 K/uL   RBC 4.43 3.87 - 5.11 MIL/uL   Hemoglobin 13.0 12.0 - 15.0 g/dL   HCT 39.6 36.0 - 46.0 %   MCV 89.4 78.0 - 100.0 fL   MCH 29.3 26.0 - 34.0 pg   MCHC 32.8 30.0 - 36.0 g/dL   RDW 13.6 11.5 - 15.5 %   Platelets 509 (H) 150 - 400 K/uL   MPV 9.0 8.6 - 12.4 fL   Neutrophils Relative % 63 43 - 77 %   Neutro Abs 8.6 (H) 1.7 - 7.7 K/uL   Lymphocytes Relative 28 12 - 46 %   Lymphs Abs 3.8 0.7 - 4.0 K/uL   Monocytes Relative 7 3 - 12 %   Monocytes Absolute 1.0 0.1 - 1.0 K/uL  Eosinophils Relative 2 0 - 5 %   Eosinophils Absolute 0.3 0.0 - 0.7 K/uL   Basophils Relative 0 0 - 1 %   Basophils Absolute 0.0 0.0 - 0.1 K/uL   Smear Review Criteria for review not met   TSH  Result Value Ref Range   TSH 1.593 0.350 - 4.500 uIU/mL  Prolactin  Result Value Ref Range   Prolactin 14.8 ng/mL  hCG, serum, qualitative  Result Value Ref Range   Preg, Serum NEG   POCT urine pregnancy  Result Value Ref Range   Preg Test, Ur Negative    [facility] reading (PRIMARY) by  Dr.Daub chest x-ray does not show any definite pneumonic infiltrates.     Assessment & Plan:   1. Cough  - CBC with Differential/Platelet - Spirometry with graph; spirometry shows signs of resected  lung disease. Will check a chest x-ray in the morning if pregnancy test is negative. She was given Prilosec to take at night along with [employer] White count is elevated chest x-ray shows no definite pneumonia we'll treat with doxycycline for 10 days.  2. Amenorrhea  - TSH - Prolactin - POCT urine pregnancy - hCG, serum, qualitative   I personally performed the services described in this documentation, which was scribed in my presence. The recorded information has been reviewed and is accurate.  Arlyss Queen, MD  [facility], [place] I personally performed the services described in this documentation, which was scribed in my presence. The recorded information has been reviewed and is accurate. 08/21/2014 5:45 PM

## 2014-08-21 NOTE — Patient Instructions (Signed)
Cough, Adult  A cough is a reflex that helps clear your throat and airways. It can help heal the body or may be a reaction to an irritated airway. A cough may only last 2 or 3 weeks (acute) or may last more than 8 weeks (chronic).  CAUSES Acute cough:  Viral or bacterial infections. Chronic cough:  Infections.  Allergies.  Asthma.  Post-nasal drip.  Smoking.  Heartburn or acid reflux.  Some medicines.  Chronic lung problems (COPD).  Cancer. SYMPTOMS   Cough.  Fever.  Chest pain.  Increased breathing rate.  High-pitched whistling sound when breathing (wheezing).  Colored mucus that you cough up (sputum). TREATMENT   A bacterial cough may be treated with antibiotic medicine.  A viral cough must run its course and will not respond to antibiotics.  Your caregiver may recommend other treatments if you have a chronic cough. HOME CARE INSTRUCTIONS   Only take over-the-counter or prescription medicines for pain, discomfort, or fever as directed by your caregiver. Use cough suppressants only as directed by your caregiver.  Use a cold steam vaporizer or humidifier in your bedroom or home to help loosen secretions.  Sleep in a semi-upright position if your cough is worse at night.  Rest as needed.  Stop smoking if you smoke. SEEK IMMEDIATE MEDICAL CARE IF:   You have pus in your sputum.  Your cough starts to worsen.  You cannot control your cough with suppressants and are losing sleep.  You begin coughing up blood.  You have difficulty breathing.  You develop pain which is getting worse or is uncontrolled with medicine.  You have a fever. MAKE SURE YOU:   Understand these instructions.  Will watch your condition.  Will get help right away if you are not doing well or get worse. Document Released: 10/10/2010 Document Revised: 07/06/2011 Document Reviewed: 10/10/2010 [employer] Patient Information 2015 [employer]. This information is not intended  to replace advice given to you by your health care provider. Make sure you discuss any questions you have with your health care provider.  

## 2014-08-22 ENCOUNTER — Other Ambulatory Visit: Payer: BLUE CROSS/BLUE SHIELD

## 2014-08-22 ENCOUNTER — Ambulatory Visit (INDEPENDENT_AMBULATORY_CARE_PROVIDER_SITE_OTHER): Payer: BLUE CROSS/BLUE SHIELD

## 2014-08-22 ENCOUNTER — Telehealth: Payer: Self-pay

## 2014-08-22 ENCOUNTER — Ambulatory Visit: Payer: BLUE CROSS/BLUE SHIELD

## 2014-08-22 DIAGNOSIS — R05 Cough: Secondary | ICD-10-CM

## 2014-08-22 DIAGNOSIS — R059 Cough, unspecified: Secondary | ICD-10-CM

## 2014-08-22 LAB — PROLACTIN: Prolactin: 14.8 ng/mL

## 2014-08-22 LAB — TSH: TSH: 1.593 u[IU]/mL (ref 0.350–4.500)

## 2014-08-22 MED ORDER — DOXYCYCLINE HYCLATE 100 MG PO TABS: 100.0000 mg | ORAL_TABLET | Freq: Two times a day (BID) | ORAL | Status: DC

## 2014-08-22 NOTE — Telephone Encounter (Signed)
Pt called about xray. Let her know it was normal and that we would call her back if you had any comments. Pt wants to know if she should take the abx that you sent in? And wants to know what her diagnosis is.

## 2014-08-22 NOTE — Telephone Encounter (Signed)
I suspect she has a bronchitis. Her chest x-ray did not show a pneumonia. I would advise her to go ahead and take the antibiotics for bronchitis

## 2014-08-22 NOTE — Addendum Note (Signed)
Addended by: Arlyss Queen A on: 08/22/2014 09:09 AM   Modules accepted: Orders

## 2014-08-23 NOTE — Telephone Encounter (Signed)
Left a detailed message letting pt know.

## 2014-09-01 ENCOUNTER — Encounter: Payer: Self-pay | Admitting: Emergency Medicine

## 2014-09-03 ENCOUNTER — Encounter: Payer: Self-pay | Admitting: Emergency Medicine

## 2014-09-03 NOTE — Telephone Encounter (Signed)
Dr Everlene Farrier asked me to call pt to make sure she gets the information to RTC that he sent her in the email. I called pt and got her VM. I left a message advising to read email, but then also gave her advise from Dr Everlene Farrier on VM.

## 2015-06-17 ENCOUNTER — Ambulatory Visit (INDEPENDENT_AMBULATORY_CARE_PROVIDER_SITE_OTHER): Payer: BLUE CROSS/BLUE SHIELD | Admitting: Family Medicine

## 2015-06-17 ENCOUNTER — Ambulatory Visit (INDEPENDENT_AMBULATORY_CARE_PROVIDER_SITE_OTHER): Payer: BLUE CROSS/BLUE SHIELD

## 2015-06-17 VITALS — BP 104/70 | HR 96 | Temp 98.5°F | Resp 18

## 2015-06-17 DIAGNOSIS — S99911A Unspecified injury of right ankle, initial encounter: Secondary | ICD-10-CM

## 2015-06-17 DIAGNOSIS — S93401A Sprain of unspecified ligament of right ankle, initial encounter: Secondary | ICD-10-CM

## 2015-06-17 DIAGNOSIS — S8251XS Displaced fracture of medial malleolus of right tibia, sequela: Secondary | ICD-10-CM

## 2015-06-17 NOTE — Patient Instructions (Addendum)
Because you received an x-ray today, you will receive an invoice from [facility]. Please contact [facility] at [phone] with questions or concerns regarding your invoice. Our billing staff will not be able to assist you with those questions.   Acute Ankle Sprain With Phase I Rehab An acute ankle sprain is a partial or complete tear in one or more of the ligaments of the ankle due to traumatic injury. The severity of the injury depends on both the number of ligaments sprained and the grade of sprain. There are 3 grades of sprains.   A grade 1 sprain is a mild sprain. There is a slight pull without obvious tearing. There is no loss of strength, and the muscle and ligament are the correct length.  A grade 2 sprain is a moderate sprain. There is tearing of fibers within the substance of the ligament where it connects two bones or two cartilages. The length of the ligament is increased, and there is usually decreased strength.  A grade 3 sprain is a complete rupture of the ligament and is uncommon. In addition to the grade of sprain, there are three types of ankle sprains.  Lateral ankle sprains: This is a sprain of one or more of the three ligaments on the outer side (lateral) of the ankle. These are the most common sprains. Medial ankle sprains: There is one large triangular ligament of the inner side (medial) of the ankle that is susceptible to injury. Medial ankle sprains are less common. Syndesmosis, "high ankle," sprains: The syndesmosis is the ligament that connects the two bones of the lower leg. Syndesmosis sprains usually only occur with very severe ankle sprains. SYMPTOMS  Pain, tenderness, and swelling in the ankle, starting at the side of injury that may progress to the whole ankle and foot with time.  "Pop" or tearing sensation at the time of injury.  Bruising that may spread to the heel.  Impaired ability to walk soon after injury. CAUSES   Acute ankle  sprains are caused by trauma placed on the ankle that temporarily forces or pries the anklebone (talus) out of its normal socket.  Stretching or tearing of the ligaments that normally hold the joint in place (usually due to a twisting injury). RISK INCREASES WITH:  Previous ankle sprain.  Sports in which the foot may land awkwardly (i.e., basketball, volleyball, or soccer) or walking or running on uneven or rough surfaces.  Shoes with inadequate support to prevent sideways motion when stress occurs.  Poor strength and flexibility.  Poor balance skills.  Contact sports. PREVENTION   Warm up and stretch properly before activity.  Maintain physical fitness:  Ankle and leg flexibility, muscle strength, and endurance.  Cardiovascular fitness.  Balance training activities.  Use proper technique and have a coach correct improper technique.  Taping, protective strapping, bracing, or high-top tennis shoes may help prevent injury. Initially, tape is best; however, it loses most of its support function within 10 to 15 minutes.  Wear proper-fitted protective shoes (High-top shoes with taping or bracing is more effective than either alone).  Provide the ankle with support during sports and practice activities for 12 months following injury. PROGNOSIS   If treated properly, ankle sprains can be expected to recover completely; however, the length of recovery depends on the degree of injury.  A grade 1 sprain usually heals enough in 5 to 7 days to allow modified activity and requires an average of 6 weeks to heal completely.  A grade 2  sprain requires 6 to 10 weeks to heal completely.  A grade 3 sprain requires 12 to 16 weeks to heal.  A syndesmosis sprain often takes more than 3 months to heal. RELATED COMPLICATIONS   Frequent recurrence of symptoms may result in a chronic problem. Appropriately addressing the problem the first time decreases the frequency of recurrence and optimizes  healing time. Severity of the initial sprain does not predict the likelihood of later instability.  Injury to other structures (bone, cartilage, or tendon).  A chronically unstable or arthritic ankle joint is a possibility with repeated sprains. TREATMENT Treatment initially involves the use of ice, medication, and compression bandages to help reduce pain and inflammation. Ankle sprains are usually immobilized in a walking cast or boot to allow for healing. Crutches may be recommended to reduce pressure on the injury. After immobilization, strengthening and stretching exercises may be necessary to regain strength and a full range of motion. Surgery is rarely needed to treat ankle sprains. MEDICATION   Nonsteroidal anti-inflammatory medications, such as aspirin and ibuprofen (do not take for the first 3 days after injury or within 7 days before surgery), or other minor pain relievers, such as acetaminophen, are often recommended. Take these as directed by your caregiver. Contact your caregiver immediately if any bleeding, stomach upset, or signs of an allergic reaction occur from these medications.  Ointments applied to the skin may be helpful.  Pain relievers may be prescribed as necessary by your caregiver. Do not take prescription pain medication for longer than 4 to 7 days. Use only as directed and only as much as you need. HEAT AND COLD  Cold treatment (icing) is used to relieve pain and reduce inflammation for acute and chronic cases. Cold should be applied for 10 to 15 minutes every 2 to 3 hours for inflammation and pain and immediately after any activity that aggravates your symptoms. Use ice packs or an ice massage.  Heat treatment may be used before performing stretching and strengthening activities prescribed by your caregiver. Use a heat pack or a warm soak. SEEK IMMEDIATE MEDICAL CARE IF:   Pain, swelling, or bruising worsens despite treatment.  You experience pain, numbness,  discoloration, or coldness in the foot or toes.  New, unexplained symptoms develop (drugs used in treatment may produce side effects.) EXERCISES  PHASE I EXERCISES RANGE OF MOTION (ROM) AND STRETCHING EXERCISES - Ankle Sprain, Acute Phase I, Weeks 1 to 2 These exercises may help you when beginning to restore flexibility in your ankle. You will likely work on these exercises for the 1 to 2 weeks after your injury. Once your physician, physical therapist, or athletic trainer sees adequate progress, he or she will advance your exercises. While completing these exercises, remember:   Restoring tissue flexibility helps normal motion to return to the joints. This allows healthier, less painful movement and activity.  An effective stretch should be held for at least 30 seconds.  A stretch should never be painful. You should only feel a gentle lengthening or release in the stretched tissue. RANGE OF MOTION - Dorsi/Plantar Flexion  While sitting with your right / left knee straight, draw the top of your foot upwards by flexing your ankle. Then reverse the motion, pointing your toes downward.  Hold each position for __________ seconds.  After completing your first set of exercises, repeat this exercise with your knee bent. Repeat __________ times. Complete this exercise __________ times per day.  RANGE OF MOTION - Ankle Alphabet  Imagine your  right / left big toe is a pen.  Keeping your hip and knee still, write out the entire alphabet with your "pen." Make the letters as large as you can without increasing any discomfort. Repeat __________ times. Complete this exercise __________ times per day.  STRENGTHENING EXERCISES - Ankle Sprain, Acute -Phase I, Weeks 1 to 2 These exercises may help you when beginning to restore strength in your ankle. You will likely work on these exercises for 1 to 2 weeks after your injury. Once your physician, physical therapist, or athletic trainer sees adequate  progress, he or she will advance your exercises. While completing these exercises, remember:   Muscles can gain both the endurance and the strength needed for everyday activities through controlled exercises.  Complete these exercises as instructed by your physician, physical therapist, or athletic trainer. Progress the resistance and repetitions only as guided.  You may experience muscle soreness or fatigue, but the pain or discomfort you are trying to eliminate should never worsen during these exercises. If this pain does worsen, stop and make certain you are following the directions exactly. If the pain is still present after adjustments, discontinue the exercise until you can discuss the trouble with your clinician. STRENGTH - Dorsiflexors  Secure a rubber exercise band/tubing to a fixed object (i.e., table, pole) and loop the other end around your right / left foot.  Sit on the floor facing the fixed object. The band/tubing should be slightly tense when your foot is relaxed.  Slowly draw your foot back toward you using your ankle and toes.  Hold this position for __________ seconds. Slowly release the tension in the band and return your foot to the starting position. Repeat __________ times. Complete this exercise __________ times per day.  STRENGTH - Plantar-flexors   Sit with your right / left leg extended. Holding onto both ends of a rubber exercise band/tubing, loop it around the ball of your foot. Keep a slight tension in the band.  Slowly push your toes away from you, pointing them downward.  Hold this position for __________ seconds. Return slowly, controlling the tension in the band/tubing. Repeat __________ times. Complete this exercise __________ times per day.  STRENGTH - Ankle Eversion  Secure one end of a rubber exercise band/tubing to a fixed object (table, pole). Loop the other end around your foot just before your toes.  Place your fists between your knees. This will  focus your strengthening at your ankle.  Drawing the band/tubing across your opposite foot, slowly, pull your little toe out and up. Make sure the band/tubing is positioned to resist the entire motion.  Hold this position for __________ seconds. Have your muscles resist the band/tubing as it slowly pulls your foot back to the starting position.  Repeat __________ times. Complete this exercise __________ times per day.  STRENGTH - Ankle Inversion  Secure one end of a rubber exercise band/tubing to a fixed object (table, pole). Loop the other end around your foot just before your toes.  Place your fists between your knees. This will focus your strengthening at your ankle.  Slowly, pull your big toe up and in, making sure the band/tubing is positioned to resist the entire motion.  Hold this position for __________ seconds.  Have your muscles resist the band/tubing as it slowly pulls your foot back to the starting position. Repeat __________ times. Complete this exercises __________ times per day.  STRENGTH - Towel Curls  Sit in a chair positioned on a non-carpeted surface.  Place your right / left foot on a towel, keeping your heel on the floor.  Pull the towel toward your heel by only curling your toes. Keep your heel on the floor.  If instructed by your physician, physical therapist, or athletic trainer, add weight to the end of the towel. Repeat __________ times. Complete this exercise __________ times per day.   This information is not intended to replace advice given to you by your health care provider. Make sure you discuss any questions you have with your health care provider.   Document Released: 11/12/2004 Document Revised: 05/04/2014 Document Reviewed: 07/26/2008 Elsevier Interactive Patient Education [employer].

## 2015-06-17 NOTE — Progress Notes (Signed)
Subjective:    Patient ID: Donna Spencer, female    DOB: [birth year], 30 y.o.   MRN: [id number] By signing my name below, I, Donna Spencer, attest that this documentation has been prepared under the direction and in the presence of Donna Cheadle, MD.  Electronically Signed: [name], Medical Scribe. 06/17/2015. 9:57 AM.  Chief Complaint  Patient presents with  . Ankle Injury    rolled, right, yesterday    HPI HPI Comments: Donna Spencer is a 30 y.o. female who presents to the [facility] complaining of sudden onset, right ankle pain secondary to rolling her ankle yesterday. She did have nausea immediately after the injury. Patient was able to ambulate after the injury. She has been icing and elevating the ankle and also took 2 ibuprofen yesterday. She had a surgery in the right foot as a child due to a torn ligament. Patient reports her ligamentous injury was over 15 years ago and healed with ORIF, but then screw was removed. She was seen by Dr. Noemi Spencer for this.  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  Past Medical History  Diagnosis Date  . Anxiety   . Anal fissure   . Benign hematuria   . Mouth ulcers     recurrent, apthous  . Allergy   . Anemia    Current Outpatient Prescriptions on File Prior to Visit  Medication Sig Dispense Refill  . amphetamine-dextroamphetamine (ADDERALL XR) 30 MG 24 hr capsule Take 30 mg by mouth every morning.     No current facility-administered medications on file prior to visit.   Allergies  Allergen Reactions  . Clarithromycin   . Codeine   . Dairy Aid [Lactase] Swelling    Avoid dairy all dairy products  . Flagyl [Metronidazole] Nausea And Vomiting and Other (See Comments)    Fever  . Tamiflu      Review of Systems  Constitutional: Positive for activity change. Negative for fever, chills and appetite  change.  Cardiovascular: Negative for leg swelling.  Musculoskeletal: Positive for joint swelling, arthralgias and gait problem. Negative for myalgias and back pain.  Skin: Positive for color change. Negative for pallor, rash and wound.  Neurological: Positive for weakness. Negative for numbness.  Hematological: Does not bruise/bleed easily.       Objective:  BP 104/70 mmHg  Pulse 96  Temp(Src) 98.5 F (36.9 C)  Resp 18  SpO2 98%  LMP 05/27/2015 (Approximate)  Physical Exam  Constitutional: She is oriented to person, place, and time. She appears well-developed and well-nourished. No distress.  HENT:  Head: Normocephalic and atraumatic.  Mouth/Throat: Oropharynx is clear and moist. No oropharyngeal exudate.  Eyes: Pupils are equal, round, and reactive to light.  Neck: Neck supple.  Cardiovascular: Normal rate.   Pulses:      Dorsalis pedis pulses are 2+ on the right side, and 2+ on the left side.       Posterior tibial pulses are 2+ on the right side, and 2+ on the left side.  Pulmonary/Chest: Effort normal.  Musculoskeletal: She exhibits tenderness. She exhibits no edema.  No tenderness over CF ligament or with squeeze test. TTP directly over lateral malleolus with moderate effusion and some mild bruising over posterior aspect. No tenderness surrounding lateral malleolus or proximal 5th metatarsal. TTP to medial malleolus and directly distal to that.  Neurological: She is  alert and oriented to person, place, and time. No cranial nerve deficit.  Strength 5/5.  Skin: Skin is warm and dry. No rash noted.  Psychiatric: She has a normal mood and affect. Her behavior is normal.  Nursing note and vitals reviewed.  Dg Ankle Complete Right  06/17/2015  CLINICAL DATA:  Twisted ankle.  Initial evaluation. EXAM: RIGHT ANKLE - COMPLETE 3+ VIEW COMPARISON:  No prior. FINDINGS: Diffuse soft tissue swelling. Tiny bony densities noted adjacent to the medial malleolus. These may represent old  fracture fragments. Acute avulsion fracture cannot be entirely excluded. No other focal bony abnormalities identified. IMPRESSION: Tiny bony densities noted adjacent to the medial malleolus. These may represent old fracture fragments. Acute medial malleolar avulsion fracture cannot be entirely excluded. No other bony abnormality otherwise noted. Diffuse soft tissue swelling. Electronically Signed   By: Marcello Moores  Register   On: 06/17/2015 10:23          Assessment & Plan:  She does have a tall cam boot which she used on her left foot 2 years ago and was instructed to wear boot at all times and RICE until improved, then switch over to Swede-O brace. Since patient is having tenderness over site that shows bone fragments and fragments of unknown duration on XR, she will follow-up for this with her orthopedic surgeon who may be better able to differentiate if this is from a prior injury or new. 1. Ankle injury, right, initial encounter   2. Sprain of ankle, right, initial encounter   3. Medial malleolar fracture, right, sequela     Orders Placed This Encounter  Procedures  . DG Ankle Complete Right    Standing Status: Future     Number of Occurrences: 1     Standing Expiration Date: 06/16/2016    Order Specific Question:  Reason for Exam (SYMPTOM  OR DIAGNOSIS REQUIRED)    Answer:  tenderness over lateral malleolus, rolled ankle yest    Order Specific Question:  Is the patient pregnant?    Answer:  No    Order Specific Question:  Preferred imaging location?    Answer:  External  . Ambulatory referral to Orthopedic Surgery    Referral Priority:  Routine    Referral Type:  Surgical    Referral Reason:  Specialty Services Required    Requested Specialty:  Orthopedic Surgery    Number of Visits Requested:  1    I personally performed the services described in this documentation, which was scribed in my presence. The recorded information has been reviewed and considered, and addended by me as  needed.  Donna Cheadle, MD MPH

## 2015-11-19 ENCOUNTER — Encounter: Payer: Self-pay | Admitting: Physician Assistant

## 2015-11-26 ENCOUNTER — Encounter: Payer: Self-pay | Admitting: Physician Assistant

## 2015-11-26 ENCOUNTER — Ambulatory Visit (INDEPENDENT_AMBULATORY_CARE_PROVIDER_SITE_OTHER): Payer: BLUE CROSS/BLUE SHIELD | Admitting: Physician Assistant

## 2015-11-26 VITALS — BP 114/70 | HR 80 | Temp 98.2°F | Resp 18 | Ht 58.5 in | Wt 194.4 lb

## 2015-11-26 DIAGNOSIS — E669 Obesity, unspecified: Secondary | ICD-10-CM | POA: Diagnosis not present

## 2015-11-26 DIAGNOSIS — Z1389 Encounter for screening for other disorder: Secondary | ICD-10-CM

## 2015-11-26 DIAGNOSIS — Z0000 Encounter for general adult medical examination without abnormal findings: Secondary | ICD-10-CM | POA: Diagnosis not present

## 2015-11-26 DIAGNOSIS — N920 Excessive and frequent menstruation with regular cycle: Secondary | ICD-10-CM

## 2015-11-26 DIAGNOSIS — Z13220 Encounter for screening for lipoid disorders: Secondary | ICD-10-CM

## 2015-11-26 DIAGNOSIS — H5712 Ocular pain, left eye: Secondary | ICD-10-CM | POA: Diagnosis not present

## 2015-11-26 DIAGNOSIS — Z139 Encounter for screening, unspecified: Secondary | ICD-10-CM

## 2015-11-26 DIAGNOSIS — Z130 Encounter for screening for diseases of the blood and blood-forming organs and certain disorders involving the immune mechanism: Secondary | ICD-10-CM | POA: Diagnosis not present

## 2015-11-26 DIAGNOSIS — Z114 Encounter for screening for human immunodeficiency virus [HIV]: Secondary | ICD-10-CM

## 2015-11-26 LAB — POC MICROSCOPIC URINALYSIS (UMFC): Mucus: ABSENT

## 2015-11-26 LAB — CBC WITH DIFFERENTIAL/PLATELET
BASOS PCT: 0 %
Basophils Absolute: 0 cells/uL (ref 0–200)
EOS PCT: 2 %
Eosinophils Absolute: 208 cells/uL (ref 15–500)
HCT: 39.6 % (ref 35.0–45.0)
Hemoglobin: 13.1 g/dL (ref 11.7–15.5)
Lymphocytes Relative: 27 %
Lymphs Abs: 2808 cells/uL (ref 850–3900)
MCH: 29.4 pg (ref 27.0–33.0)
MCHC: 33.1 g/dL (ref 32.0–36.0)
MCV: 88.8 fL (ref 80.0–100.0)
MONOS PCT: 5 %
MPV: 8.9 fL (ref 7.5–12.5)
Monocytes Absolute: 520 cells/uL (ref 200–950)
NEUTROS ABS: 6864 {cells}/uL (ref 1500–7800)
Neutrophils Relative %: 66 %
PLATELETS: 537 10*3/uL — AB (ref 140–400)
RBC: 4.46 MIL/uL (ref 3.80–5.10)
RDW: 13.2 % (ref 11.0–15.0)
WBC: 10.4 10*3/uL (ref 3.8–10.8)

## 2015-11-26 LAB — POCT URINALYSIS DIP (MANUAL ENTRY)
BILIRUBIN UA: NEGATIVE
GLUCOSE UA: NEGATIVE
LEUKOCYTES UA: NEGATIVE
Nitrite, UA: NEGATIVE
PH UA: 5.5
Spec Grav, UA: 1.025
Urobilinogen, UA: 0.2

## 2015-11-26 LAB — COMPREHENSIVE METABOLIC PANEL
ALK PHOS: 59 U/L (ref 33–115)
ALT: 15 U/L (ref 6–29)
AST: 15 U/L (ref 10–30)
Albumin: 4.5 g/dL (ref 3.6–5.1)
BUN: 12 mg/dL (ref 7–25)
CHLORIDE: 102 mmol/L (ref 98–110)
CO2: 25 mmol/L (ref 20–31)
CREATININE: 0.78 mg/dL (ref 0.50–1.10)
Calcium: 9.5 mg/dL (ref 8.6–10.2)
GLUCOSE: 91 mg/dL (ref 65–99)
Potassium: 4.3 mmol/L (ref 3.5–5.3)
SODIUM: 136 mmol/L (ref 135–146)
TOTAL PROTEIN: 7.6 g/dL (ref 6.1–8.1)
Total Bilirubin: 0.4 mg/dL (ref 0.2–1.2)

## 2015-11-26 LAB — HIV ANTIBODY (ROUTINE TESTING W REFLEX): HIV: NONREACTIVE

## 2015-11-26 LAB — LIPID PANEL
CHOLESTEROL: 222 mg/dL — AB (ref 125–200)
HDL: 55 mg/dL (ref >=46)
LDL Cholesterol: 147 mg/dL — ABNORMAL HIGH (ref <130)
Total CHOL/HDL Ratio: 4 Ratio (ref <=5.0)
Triglycerides: 98 mg/dL (ref <150)
VLDL: 20 mg/dL (ref <30)

## 2015-11-26 LAB — TSH: TSH: 2.76 mIU/L

## 2015-11-26 MED ORDER — NORGESTIMATE-ETH ESTRADIOL 0.25-35 MG-MCG PO TABS: 1.0000 | ORAL_TABLET | Freq: Every day | ORAL | 4 refills | Status: DC

## 2015-11-26 NOTE — Patient Instructions (Addendum)
See your eye specialist if your eye is not improved int he next 24-48 hours. In the meantime, use lubricating drops (I like Systane Ultra) and wear sunglasses.    IF you received an x-ray today, you will receive an invoice from [facility]. Please contact [facility] at [phone] with questions or concerns regarding your invoice.   IF you received labwork today, you will receive an invoice from [employer]. Please contact [facility] at [phone] with questions or concerns regarding your invoice.   Our billing staff will not be able to assist you with questions regarding bills from these companies.  You will be contacted with the lab results as soon as they are available. The fastest way to get your results is to activate your My Chart account. Instructions are located on the last page of this paperwork. If you have not heard from [place] regarding the results in 2 weeks, please contact this office.    Keeping You Healthy  Get These Tests 1. Blood Pressure- Have your blood pressure checked once a year by your health care provider.  Normal blood pressure is 120/80. 2. Weight- Have your body mass index (BMI) calculated to screen for obesity.  BMI is measure of body fat based on height and weight.  You can also calculate your own BMI at GravelBags.it. 3. Cholesterol- Have your cholesterol checked every 5 years starting at age 38 then yearly starting at age 38. 38. Chlamydia, HIV, and other sexually transmitted diseases- Get screened every year until age 25, then within three months of each new sexual provider. 5. Pap Test - Every 1-5 years; discuss with your health care provider. 6. Mammogram- Every 1-2 years starting at age 48--50  Take these medicines  Calcium with Vitamin D-Your body needs 1200 mg of Calcium each day and [id number] IU of Vitamin D daily.  Your body can only absorb 500 mg of Calcium at a time so Calcium must be taken in 2 or  3 divided doses throughout the day.  Multivitamin with folic acid- Once daily if it is possible for you to become pregnant.  Get these Immunizations  Gardasil-Series of three doses; prevents HPV related illness such as genital warts and cervical cancer.  Menactra-Single dose; prevents meningitis.  Tetanus shot- Every 10 years.  Flu shot-Every year.  Take these steps 1. Do not smoke-Your healthcare provider can help you quit.  For tips on how to quit go to www.smokefree.gov or call 1-800 QUITNOW. 2. Be physically active- Exercise 5 days a week for at least 30 minutes.  If you are not already physically active, start slow and gradually work up to 30 minutes of moderate physical activity.  Examples of moderate activity include walking briskly, dancing, swimming, bicycling, etc. 3. Breast Cancer- A self breast exam every month is important for early detection of breast cancer.  For more information and instruction on self breast exams, ask your healthcare provider or https://www.patel.info/. 4. Eat a healthy diet- Eat a variety of healthy foods such as fruits, vegetables, whole grains, low fat milk, low fat cheeses, yogurt, lean meats, poultry and fish, beans, nuts, tofu, etc.  For more information go to www. Thenutritionsource.org 5. Drink alcohol in moderation- Limit alcohol intake to one drink or less per day. Never drink and drive. 6. Depression- Your emotional health is as important as your physical health.  If you're feeling down or losing interest in things you normally enjoy please talk to your healthcare provider about being screened for  depression. 7. Dental visit- Brush and floss your teeth twice daily; visit your dentist twice a year. 8. Eye doctor- Get an eye exam at least every 2 years. 9. Helmet use- Always wear a helmet when riding a bicycle, motorcycle, rollerblading or skateboarding. 74. Safe sex- If you may be exposed to sexually transmitted infections,  use a condom. 11. Seat belts- Seat belts can save your live; always wear one. 12. Smoke/Carbon Monoxide detectors- These detectors need to be installed on the appropriate level of your home. Replace batteries at least once a year. 13. Skin cancer- When out in the sun please cover up and use sunscreen 15 SPF or higher. 14. Violence- If anyone is threatening or hurting you, please tell your healthcare provider.

## 2015-11-26 NOTE — Progress Notes (Signed)
Subjective:    Patient ID: Donna Spencer, female    DOB: 05/11/1985, 30 y.o.   MRN: [id number]  HPI Donna Spencer presents for annual exam without PAP today. Doing well overall. Experiencing menorrhagia 1-2 days per cycle with severe pain associated. Would like to try OCPs until she can get in to see GYN and discuss IUD. Has taken OCPs in the past, but has been off them for about 1 year.  Experiencing pain in her LEFT eye. Felt like she may have gotten something in it last night and was rubbing the eye. Associated drainage, swelling, photophobia, foreign body sensation. Does not wear contact lenses.   Review of Systems  Constitutional: Negative for activity change, appetite change, fatigue and fever.  HENT: Positive for rhinorrhea. Negative for congestion, hearing loss, sore throat and tinnitus.   Eyes: Positive for photophobia, pain, discharge, redness and visual disturbance. Negative for itching.  Respiratory: Negative for cough and shortness of breath.   Cardiovascular: Negative for chest pain and palpitations.  Gastrointestinal: Negative for abdominal pain, constipation, diarrhea, nausea and vomiting.  Endocrine: Negative for polydipsia and polyuria.  Genitourinary: Positive for menstrual problem. Negative for difficulty urinating and pelvic pain.  Musculoskeletal: Negative.   Skin: Negative.   Neurological: Negative for dizziness, numbness and headaches.  Psychiatric/Behavioral: Negative for dysphoric mood, sleep disturbance and suicidal ideas. The patient is nervous/anxious.    Past Medical History:  Diagnosis Date  . Allergy   . Anal fissure   . Anemia   . Anxiety   . Benign hematuria   . Mouth ulcers    recurrent, apthous   Family History  Problem Relation Age of Onset  . Hypertension Mother   . Cancer Maternal Grandmother   . GER disease Paternal Grandmother   . COPD Paternal Grandfather    Social History   Social History  . Marital status: Married    Spouse  name: Iona Beard   . Number of children: N/A  . Years of education: Doctorate   Occupational History  . Attorney     Social History Main Topics  . Smoking status: Never Smoker  . Smokeless tobacco: Never Used  . Alcohol use No     Comment: occasionally - couple times/month (liquor)  . Drug use: No  . Sexual activity: Yes    Birth control/ protection: Condom     Comment: may want to consider pill again    Other Topics Concern  . Not on file   Social History Narrative  . No narrative on file     Current Outpatient Prescriptions:  .  Adapalene-Benzoyl Peroxide (EPIDUO) 0.1-2.5 % gel, Apply topically at bedtime., Disp: , Rfl:  .  amphetamine-dextroamphetamine (ADDERALL XR) 30 MG 24 hr capsule, Take 30 mg by mouth every morning., Disp: , Rfl:    Allergies  Allergen Reactions  . Clarithromycin   . Codeine   . Dairy Aid [Lactase] Swelling    Avoid dairy all dairy products  . Flagyl [Metronidazole] Nausea And Vomiting and Other (See Comments)    Fever  . Tamiflu       Objective:   Physical Exam  Constitutional: She is oriented to person, place, and time. She appears well-developed and well-nourished. She is cooperative. No distress.  Blood pressure 114/70, pulse 80, temperature 98.2 F (36.8 C), temperature source Oral, resp. rate 18, height 4' 10.5" (1.486 m), weight 194 lb 6.4 oz (88.2 kg), last menstrual period 10/30/2015, SpO2 97 %.  HENT:  Head: Normocephalic and atraumatic.  Right Ear: Hearing, tympanic membrane, external ear and ear canal normal.  Left Ear: Hearing, tympanic membrane, external ear and ear canal normal.  Nose: Nose normal.  Mouth/Throat: Uvula is midline, oropharynx is clear and moist and mucous membranes are normal.  Eyes: Pupils are equal, round, and reactive to light. Lids are everted and swept, no foreign bodies found. Right eye exhibits no discharge and no exudate. Left eye exhibits discharge. Left eye exhibits no exudate. No foreign body present in  the left eye. Right conjunctiva is not injected. Right conjunctiva has no hemorrhage. Left conjunctiva is injected. Left conjunctiva has no hemorrhage. No scleral icterus.  Wood's lamp exam of LEFT eye revealed no foreign bodies, scratch or abrasion.  Neck: Trachea normal and normal range of motion. Neck supple. No thyromegaly present.  Cardiovascular: Normal rate, regular rhythm, normal heart sounds and intact distal pulses.   Pulmonary/Chest: Effort normal and breath sounds normal. Right breast exhibits no inverted nipple, no mass, no nipple discharge, no skin change and no tenderness. Left breast exhibits no inverted nipple, no mass, no nipple discharge, no skin change and no tenderness. Breasts are symmetrical.  Abdominal: Soft. Normal appearance and bowel sounds are normal. There is no tenderness.  Musculoskeletal: Normal range of motion.       Cervical back: She exhibits no tenderness.       Thoracic back: She exhibits no tenderness.       Lumbar back: She exhibits no tenderness.  Lymphadenopathy:    She has no cervical adenopathy.  Neurological: She is alert and oriented to person, place, and time. She has normal strength. No sensory deficit.  Reflex Scores:      Patellar reflexes are 1+ on the right side and 1+ on the left side. Skin: Skin is warm, dry and intact.  Psychiatric: She has a normal mood and affect. Her speech is normal and behavior is normal.          Assessment & Plan:  1. Encounter for annual physical exam Age appropriate anticipatory guidance provided  2. Obesity - encouraged healthy eating and exercise - Comprehensive metabolic panel - Lipid panel - TSH  3. Screening for hyperlipidemia - Lipid panel  4. Screening for blood or protein in urine - POCT Microscopic Urinalysis ([facility]) - POCT urinalysis dipstick  5. Screening for deficiency anemia - CBC with Differential/Platelet  6. Screening for HIV (human immunodeficiency virus) - HIV antibody  7.  Menorrhagia with regular cycle - plans to see GYN in October, will decide then whether she would like an IUD or to continue with OCP - CBC with Differential/Platelet - norgestimate-ethinyl estradiol (ORTHO-CYCLEN,SPRINTEC,PREVIFEM) 0.25-35 MG-MCG tablet; Take 1 tablet by mouth daily.  Dispense: 3 Package; Refill: 4  8. Eye pain, left - Wood's lamp examination revealed no foreign body or abrasion - advised using rewetting drops to help with irritation - if not improved in 24-48 hrs, advised to go see eye doctor  Claiborne Billings Rayburn PA-S 11/26/15

## 2015-11-26 NOTE — Progress Notes (Signed)
Patient ID: Donna Spencer, female    DOB: 06-08-1985, 30 y.o.   MRN: [id number]  PCP: No primary care provider on file.  Chief Complaint  Patient presents with  . Annual Exam  . Eye Pain    Left eye x 1 day    Subjective:   HPI: Presents for annual exam.  Establishing with GYN this fall for possible IUD placement. Intends to have pelvic exam and cervical cancer screening then, but desires breast exam here today stating, "I'm paranoid." She is interested in re-starting COC to help reduce the heaviness of her menstrual flow until she can get in to see GYN.  In addition, she reports LEFT eye pain since yesterday evening after rubbing her eyes. She saw some flakes of mascara, and thinks that one perhaps got in her eye. She has light sensitivity, FB sensation. She does not have visual disturbance, but notes that the eye is watering a lot. The pain is 10/10, and she considered going to the ED over night, but decided to wait "since I was already coming here."    Patient Active Problem List   Diagnosis Date Noted  . Obesity 01/26/2014  . Uses oral contraceptives 01/26/2014  . Anxiety   . Anal fissure   . Benign hematuria   . Mouth ulcers     Past Medical History:  Diagnosis Date  . Allergy   . Anal fissure   . Anemia   . Anxiety   . Benign hematuria   . Mouth ulcers    recurrent, apthous     Prior to Admission medications   Medication Sig Start Date End Date Taking? Authorizing Provider  Adapalene-Benzoyl Peroxide (EPIDUO) 0.1-2.5 % gel Apply topically at bedtime.   Yes Historical Provider, MD  amphetamine-dextroamphetamine (ADDERALL XR) 30 MG 24 hr capsule Take 30 mg by mouth every morning.   Yes Historical Provider, MD    Allergies  Allergen Reactions  . Clarithromycin   . Codeine   . Dairy Aid [Lactase] Swelling    Avoid dairy all dairy products  . Flagyl [Metronidazole] Nausea And Vomiting and Other (See Comments)    Fever  . Tamiflu     Past Surgical  History:  Procedure Laterality Date  . REPAIR ANKLE LIGAMENT Right 2002    Family History  Problem Relation Age of Onset  . Hypertension Mother   . Cancer Maternal Grandmother   . GER disease Paternal Grandmother   . COPD Paternal Grandfather     Social History   Social History  . Marital status: Married    Spouse name: Iona Beard   . Number of children: N/A  . Years of education: Doctorate   Occupational History  . Attorney     Social History Main Topics  . Smoking status: Never Smoker  . Smokeless tobacco: Never Used  . Alcohol use No     Comment: occasionally - couple times/month (liquor)  . Drug use: No  . Sexual activity: Yes    Birth control/ protection: Condom     Comment: may want to consider pill again    Other Topics Concern  . None   Social History Narrative  . None       Review of Systems  Constitutional: Negative.   HENT: Negative.   Eyes: Positive for photophobia, pain and discharge (watery). Negative for redness, itching and visual disturbance.  Respiratory: Negative.   Cardiovascular: Negative.   Gastrointestinal: Negative.   Endocrine: Negative.   Genitourinary: Positive for menstrual problem (  very heavy periods). Negative for dysuria, frequency, hematuria (history of benign hematuria) and urgency.  Musculoskeletal: Negative.   Skin: Negative.   Allergic/Immunologic: Negative.   Neurological: Negative.   Hematological: Negative.   Psychiatric/Behavioral: Negative.         Objective:  Physical Exam  Constitutional: She is oriented to person, place, and time. Vital signs are normal. She appears well-developed and well-nourished. She is active and cooperative. No distress.  BP 114/70 (BP Location: Left Arm, Patient Position: Sitting, Cuff Size: Large)   Pulse 80   Temp 98.2 F (36.8 C) (Oral)   Resp 18   Ht 4' 10.5" (1.486 m)   Wt 194 lb 6.4 oz (88.2 kg)   LMP 10/30/2015 (LMP Unknown)   SpO2 97%   BMI 39.94 kg/m    HENT:  Head:  Normocephalic and atraumatic.  Right Ear: Hearing, tympanic membrane, external ear and ear canal normal. No foreign bodies.  Left Ear: Hearing, tympanic membrane, external ear and ear canal normal. No foreign bodies.  Nose: Nose normal.  Mouth/Throat: Uvula is midline, oropharynx is clear and moist and mucous membranes are normal. No oral lesions. Normal dentition. No dental abscesses or uvula swelling. No oropharyngeal exudate.  Eyes: Conjunctivae and EOM are normal. Pupils are equal, round, and reactive to light. Lids are everted and swept, no foreign bodies found. Right eye exhibits no chemosis, no discharge, no exudate and no hordeolum. No foreign body present in the right eye. Left eye exhibits discharge (clear, tearing). Left eye exhibits no chemosis, no exudate and no hordeolum. No foreign body present in the left eye. No scleral icterus.  Fundoscopic exam:      The right eye shows no arteriolar narrowing, no AV nicking, no exudate, no hemorrhage and no papilledema. The right eye shows red reflex.       The left eye shows no arteriolar narrowing, no AV nicking, no exudate, no hemorrhage and no papilledema. The left eye shows red reflex.  Slit lamp exam:      The left eye shows no corneal abrasion, no corneal flare, no corneal ulcer, no foreign body, no hyphema, no hypopyon, no fluorescein uptake and no anterior chamber bulge.  2 drops of proparacaine and fluorescein instilled in the LEFT eye. Following exam, eye irrigated copiously with saline.  Neck: Trachea normal, normal range of motion and full passive range of motion without pain. Neck supple. No spinous process tenderness and no muscular tenderness present. No thyroid mass and no thyromegaly present.  Cardiovascular: Normal rate, regular rhythm, normal heart sounds, intact distal pulses and normal pulses.   Pulmonary/Chest: Effort normal and breath sounds normal. Right breast exhibits no inverted nipple, no mass, no nipple discharge, no  skin change and no tenderness. Left breast exhibits no inverted nipple, no mass, no nipple discharge, no skin change and no tenderness. Breasts are symmetrical.  Musculoskeletal: She exhibits no edema or tenderness.       Cervical back: Normal.       Thoracic back: Normal.       Lumbar back: Normal.  Lymphadenopathy:       Head (right side): No tonsillar, no preauricular, no posterior auricular and no occipital adenopathy present.       Head (left side): No tonsillar, no preauricular, no posterior auricular and no occipital adenopathy present.    She has no cervical adenopathy.       Right: No supraclavicular adenopathy present.       Left: No supraclavicular adenopathy present.  Neurological: She is alert and oriented to person, place, and time. She has normal strength and normal reflexes. No cranial nerve deficit. She exhibits normal muscle tone. Coordination and gait normal.  Skin: Skin is warm, dry and intact. No rash noted. She is not diaphoretic. No cyanosis or erythema. Nails show no clubbing.  Psychiatric: She has a normal mood and affect. Her speech is normal and behavior is normal. Judgment and thought content normal.           Assessment & Plan:  1. Encounter for annual physical exam Age appropriate anticipatory guidance provided. Proceed with GYN visit this fall as planned.  2. Obesity Healthy eating, regular exercise. - Comprehensive metabolic panel - Lipid panel - TSH  3. Screening for hyperlipidemia - Lipid panel  4. Screening for blood or protein in urine - POCT Microscopic Urinalysis ([facility]) - POCT urinalysis dipstick  5. Screening for deficiency anemia - CBC with Differential/Platelet  6. Screening for HIV (human immunodeficiency virus) - HIV antibody  7. Menorrhagia with regular cycle Trial of ortho-cyclen.  - CBC with Differential/Platelet - norgestimate-ethinyl estradiol (ORTHO-CYCLEN,SPRINTEC,PREVIFEM) 0.25-35 MG-MCG tablet; Take 1 tablet by mouth  daily.  Dispense: 3 Package; Refill: 4  8. Eye pain, left No abrasion or FB found. Recommend sun protection and hydrating eye drops. If symptoms persist, she should contact her eye specialist.   Fara Chute, PA-C Physician Assistant-Certified [facility]

## 2015-11-28 ENCOUNTER — Encounter: Payer: Self-pay | Admitting: Physician Assistant

## 2016-02-07 DIAGNOSIS — N946 Dysmenorrhea, unspecified: Secondary | ICD-10-CM | POA: Insufficient documentation

## 2016-02-21 ENCOUNTER — Ambulatory Visit (INDEPENDENT_AMBULATORY_CARE_PROVIDER_SITE_OTHER): Payer: BLUE CROSS/BLUE SHIELD | Admitting: Physician Assistant

## 2016-02-21 VITALS — BP 110/74 | HR 84 | Temp 98.0°F | Resp 18 | Ht 58.5 in | Wt 185.6 lb

## 2016-02-21 DIAGNOSIS — J069 Acute upper respiratory infection, unspecified: Secondary | ICD-10-CM | POA: Diagnosis not present

## 2016-02-21 DIAGNOSIS — B9789 Other viral agents as the cause of diseases classified elsewhere: Secondary | ICD-10-CM

## 2016-02-21 DIAGNOSIS — J029 Acute pharyngitis, unspecified: Secondary | ICD-10-CM

## 2016-02-21 LAB — POCT RAPID STREP A (OFFICE): Rapid Strep A Screen: NEGATIVE

## 2016-02-21 MED ORDER — FLUTICASONE PROPIONATE 50 MCG/ACT NA SUSP: 2.0000 | Freq: Every day | NASAL | 6 refills | Status: DC

## 2016-02-21 MED ORDER — HYDROCODONE-HOMATROPINE 5-1.5 MG/5ML PO SYRP: 5.0000 mL | ORAL_SOLUTION | Freq: Three times a day (TID) | ORAL | 0 refills | Status: DC | PRN

## 2016-02-21 MED ORDER — ALBUTEROL SULFATE 108 (90 BASE) MCG/ACT IN AEPB: 2.0000 | INHALATION_SPRAY | RESPIRATORY_TRACT | 0 refills | Status: DC | PRN

## 2016-02-21 MED ORDER — GUAIFENESIN ER 1200 MG PO TB12: 1.0000 | ORAL_TABLET | Freq: Two times a day (BID) | ORAL | 0 refills | Status: AC

## 2016-02-21 NOTE — Progress Notes (Signed)
Donna Spencer  MRN: [id number] DOB: Feb 18, 1986  Subjective:  Pt presents to clinic with cold symptoms or the last 4 days - she started with a tickle in her throat and now she has a sore throat. Nasal congestion started today and the rhinorrhea is clear - she is coughing up some yellow mucus from her throat. She has been  Using cough drops and excedrin 2 days ago.  Just returned from a conference in [place].  Review of Systems  Constitutional: Positive for chills and fever.  HENT: Positive for congestion, postnasal drip, rhinorrhea and sore throat.   Respiratory: Positive for cough. Negative for shortness of breath and wheezing.        No h/o asthma, she has needed an inhaler in the past, nonsmoker  Psychiatric/Behavioral: Positive for sleep disturbance (cough and congestion).    Patient Active Problem List   Diagnosis Date Noted  . Obesity 01/26/2014  . Uses oral contraceptives 01/26/2014  . Anxiety   . Anal fissure   . Benign hematuria   . Mouth ulcers     Current Outpatient Prescriptions on File Prior to Visit  Medication Sig Dispense Refill  . Adapalene-Benzoyl Peroxide (EPIDUO) 0.1-2.5 % gel Apply topically at bedtime.    Marland Kitchen amphetamine-dextroamphetamine (ADDERALL XR) 30 MG 24 hr capsule Take 30 mg by mouth every morning.    . norgestimate-ethinyl estradiol (ORTHO-CYCLEN,SPRINTEC,PREVIFEM) 0.25-35 MG-MCG tablet Take 1 tablet by mouth daily. 3 Package 4   No current facility-administered medications on file prior to visit.     Allergies  Allergen Reactions  . Clarithromycin   . Codeine   . Dairy Aid [Lactase] Swelling    Avoid dairy all dairy products  . Flagyl [Metronidazole] Nausea And Vomiting and Other (See Comments)    Fever  . Tamiflu     Pt patients past, family and social history were reviewed and updated.  Objective:  BP 110/74 (BP Location: Right Arm, Patient Position: Sitting, Cuff Size: Small)   Pulse 84   Temp 98 F (36.7 C) (Oral)   Resp  18   Ht 4' 10.5" (1.486 m)   Wt 185 lb 9.6 oz (84.2 kg)   LMP 01/22/2016   SpO2 98%   BMI 38.13 kg/m   Physical Exam  Constitutional: She is oriented to person, place, and time and well-developed, well-nourished, and in no distress.  HENT:  Head: Normocephalic and atraumatic.  Right Ear: Hearing, external ear and ear canal normal. A middle ear effusion is present.  Left Ear: Hearing, tympanic membrane, external ear and ear canal normal.  No middle ear effusion.  Nose: Mucosal edema (red) present.  Mouth/Throat: Uvula is midline, oropharynx is clear and moist and mucous membranes are normal.  Eyes: Conjunctivae are normal.  Neck: Normal range of motion.  Cardiovascular: Normal rate, regular rhythm and normal heart sounds.   No murmur heard. Pulmonary/Chest: Effort normal and breath sounds normal.  Neurological: She is alert and oriented to person, place, and time. Gait normal.  Skin: Skin is warm and dry.  Psychiatric: Mood, memory, affect and judgment normal.  Vitals reviewed.  Results for orders placed or performed in visit on 02/21/16  POCT rapid strep A  Result Value Ref Range   Rapid Strep A Screen Negative Negative    Assessment and Plan :  Sore throat - Plan: POCT rapid strep A  Viral URI with cough - Plan: HYDROcodone-homatropine (HYCODAN) 5-1.5 MG/5ML syrup, fluticasone (FLONASE) 50 MCG/ACT nasal spray, Guaifenesin (MUCINEX MAXIMUM STRENGTH) 1200 MG  TB12, Albuterol Sulfate (PROAIR RESPICLICK) 123XX123 (90 Base) MCG/ACT AEPB   Symptomatic care d/w pt.  Windell Hummingbird PA-C  [facility] 02/21/2016 4:43 PM

## 2016-02-21 NOTE — Patient Instructions (Signed)
     IF you received an x-ray today, you will receive an invoice from [facility]. Please contact [facility] at [phone] with questions or concerns regarding your invoice.   IF you received labwork today, you will receive an invoice from [facility]. Please contact [facility] at [phone] with questions or concerns regarding your invoice.   Our billing staff will not be able to assist you with questions regarding bills from these companies.  You will be contacted with the lab results as soon as they are available. The fastest way to get your results is to activate your My Chart account. Instructions are located on the last page of this paperwork. If you have not heard from us regarding the results in 2 weeks, please contact this office.      

## 2016-04-01 NOTE — Progress Notes (Signed)
Erroneous epic generated encounter. Philis Fendt, MS, PA-C 9:46 AM, 04/01/2016

## 2016-04-07 ENCOUNTER — Telehealth: Payer: Self-pay

## 2016-04-07 NOTE — Telephone Encounter (Signed)
Fax req from [facility] re: is pt taking active tabs Yes or No- for insurance purposes.  LMOVM for pt to return call with answer.

## 2016-04-27 IMAGING — CR DG CHEST 2V
2 series · 2 of 2 positions shown · non-contrast
Comparison: None.

CLINICAL DATA: Cough and elevated white blood cell count.

EXAM:
CHEST  2 VIEW

[PA]
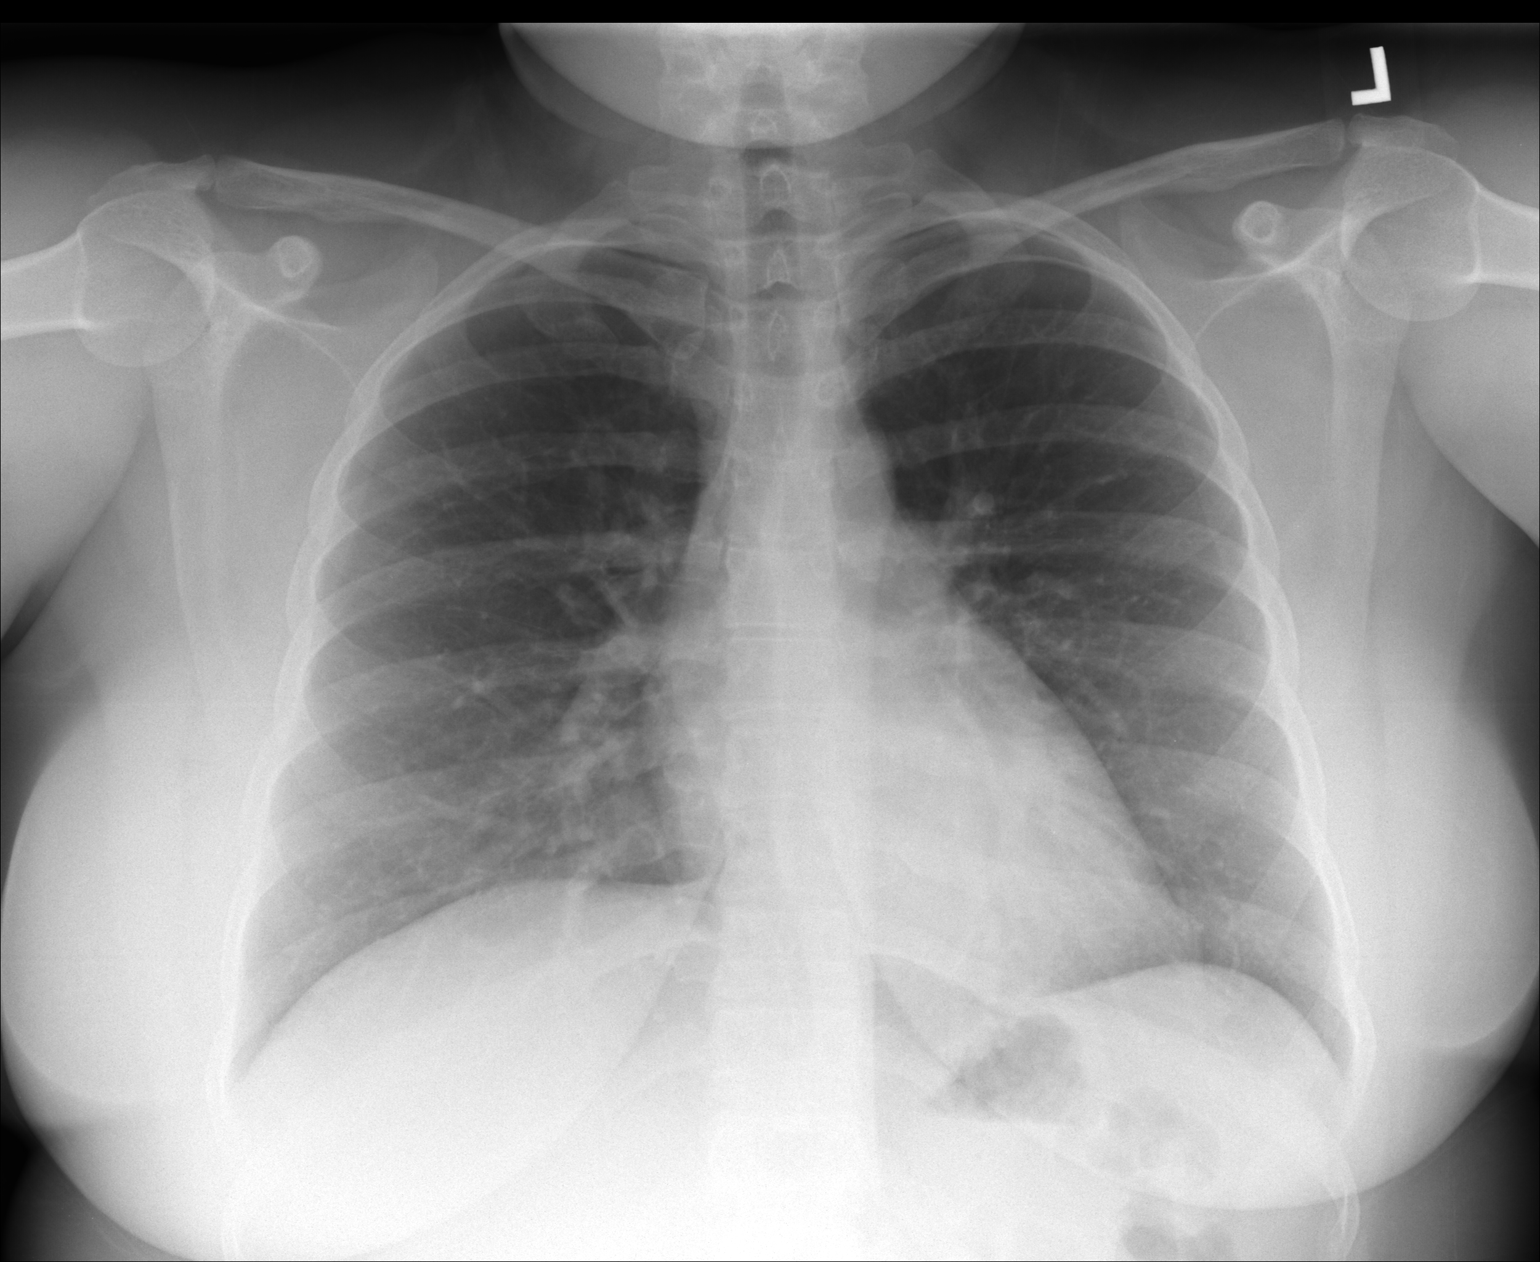

[lateral]
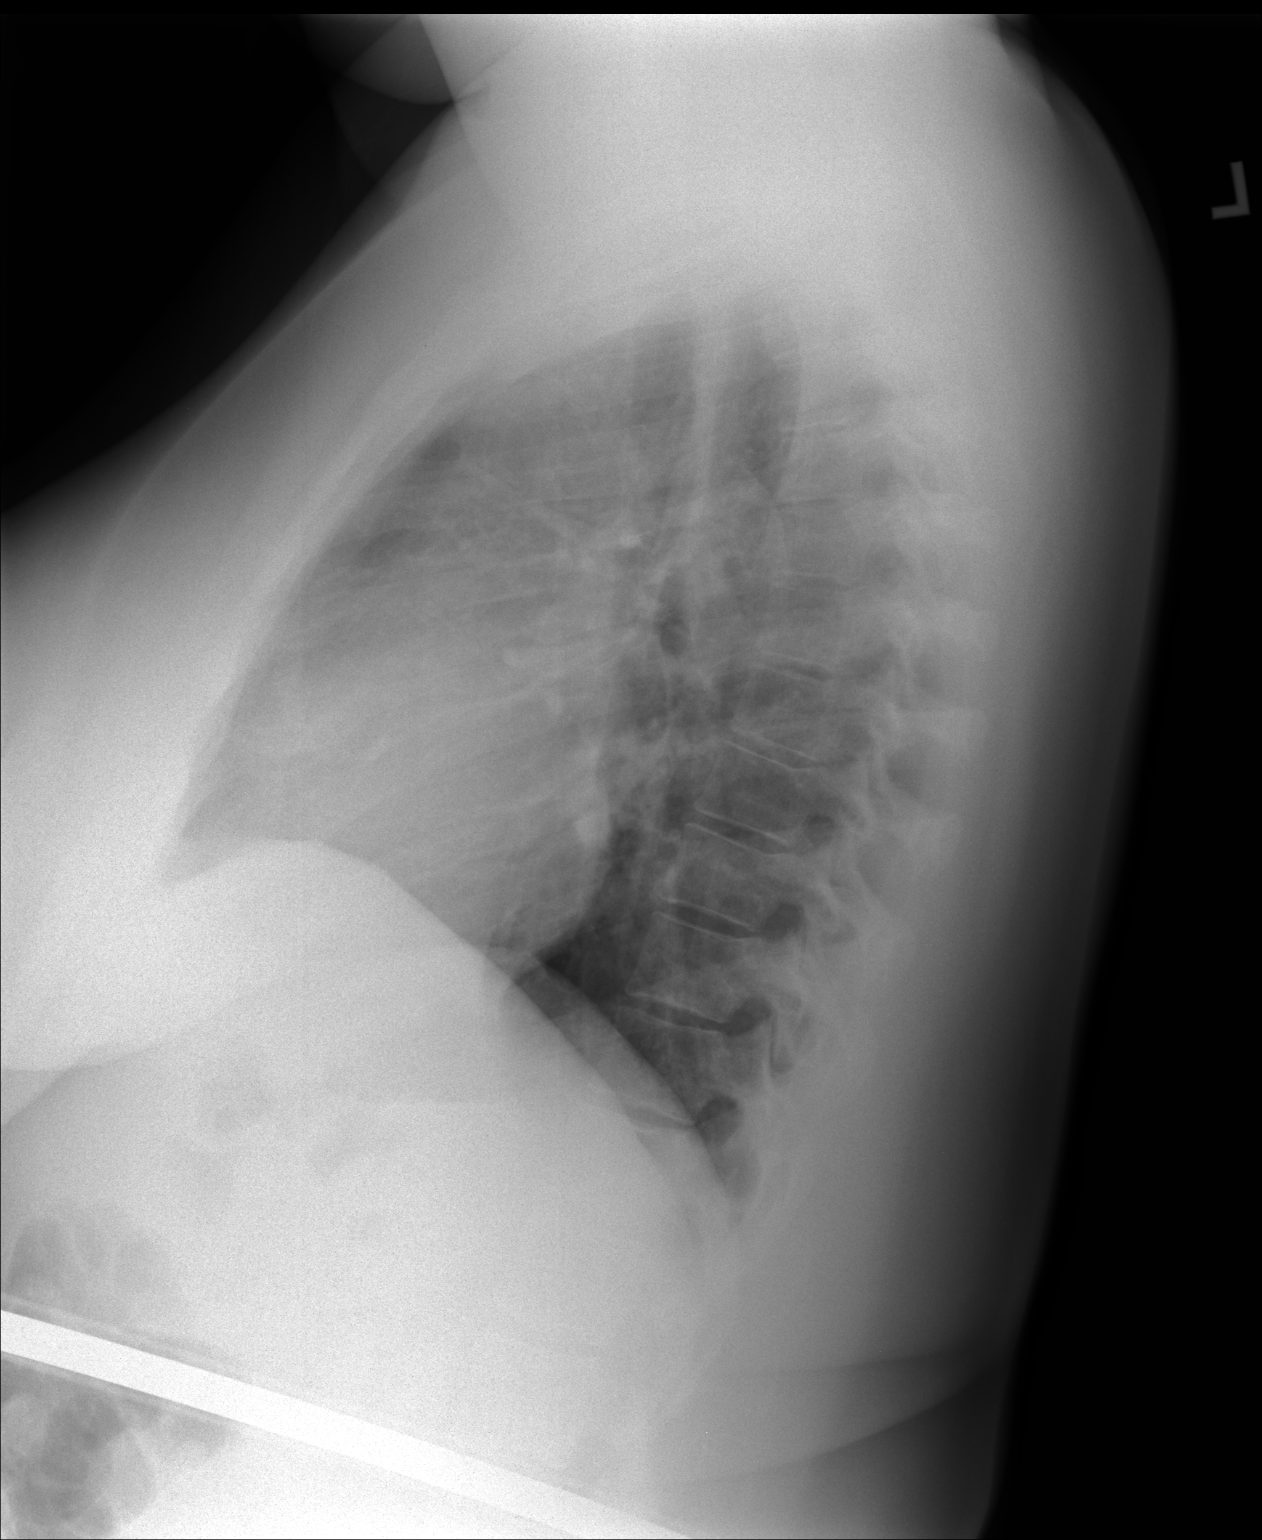

[2 of 2 positions shown; findings below may reference images not displayed]

FINDINGS: The cardiomediastinal silhouette is within normal limits. The lungs
are well inflated and clear. There is no evidence of pleural
effusion or pneumothorax. No acute osseous abnormality is
identified.
IMPRESSION: No active cardiopulmonary disease.

## 2017-06-04 ENCOUNTER — Other Ambulatory Visit: Payer: Self-pay | Admitting: Physician Assistant

## 2017-06-04 DIAGNOSIS — N644 Mastodynia: Secondary | ICD-10-CM

## 2017-06-09 ENCOUNTER — Ambulatory Visit
Admission: RE | Admit: 2017-06-09 | Discharge: 2017-06-09 | Disposition: A | Discharge status: Home / Self Care | Payer: BLUE CROSS/BLUE SHIELD | Source: Ambulatory Visit | Attending: Physician Assistant | Admitting: Physician Assistant

## 2017-06-09 DIAGNOSIS — N644 Mastodynia: Secondary | ICD-10-CM

## 2017-07-29 ENCOUNTER — Encounter: Payer: Self-pay | Admitting: Physician Assistant

## 2018-05-11 ENCOUNTER — Ambulatory Visit (HOSPITAL_COMMUNITY)
Admission: EM | Admit: 2018-05-11 | Discharge: 2018-05-11 | Disposition: A | Discharge status: Home / Self Care | Payer: BLUE CROSS/BLUE SHIELD

## 2018-05-11 ENCOUNTER — Ambulatory Visit: Payer: BLUE CROSS/BLUE SHIELD | Admitting: Family Medicine

## 2018-05-11 ENCOUNTER — Encounter (HOSPITAL_COMMUNITY): Payer: Self-pay

## 2018-05-11 DIAGNOSIS — J209 Acute bronchitis, unspecified: Secondary | ICD-10-CM

## 2018-05-11 MED ORDER — ALBUTEROL SULFATE HFA 108 (90 BASE) MCG/ACT IN AERS: 1.0000 | INHALATION_SPRAY | Freq: Four times a day (QID) | RESPIRATORY_TRACT | 0 refills | Status: DC | PRN

## 2018-05-11 MED ORDER — AZITHROMYCIN 250 MG PO TABS: 250.0000 mg | ORAL_TABLET | Freq: Every day | ORAL | 0 refills | Status: DC

## 2018-05-11 MED ORDER — CETIRIZINE HCL 10 MG PO CAPS: 10.0000 mg | ORAL_CAPSULE | Freq: Every day | ORAL | 0 refills | Status: DC

## 2018-05-11 MED ORDER — HYDROCODONE-HOMATROPINE 5-1.5 MG/5ML PO SYRP: 5.0000 mL | ORAL_SOLUTION | Freq: Every evening | ORAL | 0 refills | Status: DC | PRN

## 2018-05-11 MED ORDER — PREDNISONE 50 MG PO TABS: 50.0000 mg | ORAL_TABLET | Freq: Every day | ORAL | 0 refills | Status: AC

## 2018-05-11 MED ORDER — ONDANSETRON 4 MG PO TBDP: 4.0000 mg | ORAL_TABLET | Freq: Three times a day (TID) | ORAL | 0 refills | Status: DC | PRN

## 2018-05-11 NOTE — ED Provider Notes (Signed)
[facility]    CSN: [id number] Arrival date & time: 05/11/18  4401     History   Chief Complaint Chief Complaint  Patient presents with  . Cough    HPI Donna Spencer is a 33 y.o. female Past medical history presenting today for evaluation of cough.  Patient states she has had a cough for the past 6 days.  Cough is been productive with green mucus.  She initially had sore throat, nasal congestion and headache, but this is relatively resolved.  Her symptoms are mainly down in her chest.  She was seen on day 2 of her illness and was given an injection of Decadron as well as sent home with Augmentin to begin taking if symptoms not improving.  She has not started taking this.  Fevers.  She is concerned as many antibiotics cause nausea and vomiting.  She has noted some mild wheezing.  Denies history of asthma and smoking.  Has tried Mucinex, ZzzQuil, without relief.  HPI  Past Medical History:  Diagnosis Date  . Allergy   . Anal fissure   . Anemia   . Anxiety   . Benign hematuria   . Mouth ulcers    recurrent, apthous    Patient Active Problem List   Diagnosis Date Noted  . Obesity 01/26/2014  . Uses oral contraceptives 01/26/2014  . Anxiety   . Anal fissure   . Benign hematuria   . Mouth ulcers     Past Surgical History:  Procedure Laterality Date  . REPAIR ANKLE LIGAMENT Right 2002    OB History   No obstetric history on file.      Home Medications    Prior to Admission medications   Medication Sig Start Date End Date Taking? Authorizing Provider  metFORMIN (GLUCOPHAGE) 500 MG tablet Take by mouth 2 (two) times daily with a meal.   Yes [provider]  Adapalene-Benzoyl Peroxide (EPIDUO) 0.1-2.5 % gel Apply topically at bedtime.    [provider]  albuterol (PROVENTIL HFA;VENTOLIN HFA) 108 (90 Base) MCG/ACT inhaler Inhale 1-2 puffs into the lungs every 6 (six) hours as needed for wheezing or shortness of breath. 05/11/18    Iliyana Convey C, PA-C  amphetamine-dextroamphetamine (ADDERALL XR) 30 MG 24 hr capsule Take 30 mg by mouth every morning.    [provider]  azithromycin (ZITHROMAX) 250 MG tablet Take 1 tablet (250 mg total) by mouth daily. Take first 2 tablets together, then 1 every day until finished. 05/11/18   Laker Thompson C, PA-C  Cetirizine HCl 10 MG CAPS Take 1 capsule (10 mg total) by mouth daily for 10 days. 05/11/18 05/21/18  Naksh Radi C, PA-C  fluticasone (FLONASE) 50 MCG/ACT nasal spray Place 2 sprays into both nostrils daily. 02/21/16   Weber, Damaris Hippo, PA-C  HYDROcodone-homatropine (HYCODAN) 5-1.5 MG/5ML syrup Take 5 mLs by mouth at bedtime as needed for cough. 05/11/18   Keyonte Cookston C, PA-C  norgestimate-ethinyl estradiol (ORTHO-CYCLEN,SPRINTEC,PREVIFEM) 0.25-35 MG-MCG tablet Take 1 tablet by mouth daily. 11/26/15   Harrison Mons, PA  ondansetron (ZOFRAN ODT) 4 MG disintegrating tablet Take 1 tablet (4 mg total) by mouth every 8 (eight) hours as needed for nausea or vomiting. 05/11/18   Alazae Crymes C, PA-C  predniSONE (DELTASONE) 50 MG tablet Take 1 tablet (50 mg total) by mouth daily for 5 days. 05/11/18 05/16/18  Cantrell Martus, Elesa Hacker, PA-C    Family History Family History  Problem Relation Age of Onset  . Hypertension Mother   .  Cancer Maternal Grandmother   . Breast cancer Maternal Grandmother   . GER disease Paternal Grandmother   . COPD Paternal Grandfather     Social History Social History   Tobacco Use  . Smoking status: Never Smoker  . Smokeless tobacco: Never Used  Substance Use Topics  . Alcohol use: No    Comment: occasionally - couple times/month (liquor)  . Drug use: No     Allergies   Clarithromycin; Codeine; Dairy aid [lactase]; Flagyl [metronidazole]; and Oseltamivir phosphate   Review of Systems Review of Systems  Constitutional: Negative for activity change, appetite change, chills, fatigue and fever.  HENT: Positive for congestion. Negative  for ear pain, rhinorrhea, sinus pressure, sore throat and trouble swallowing.   Eyes: Negative for discharge and redness.  Respiratory: Positive for cough. Negative for chest tightness and shortness of breath.   Cardiovascular: Negative for chest pain.  Gastrointestinal: Negative for abdominal pain, diarrhea, nausea and vomiting.  Musculoskeletal: Negative for myalgias.  Skin: Negative for rash.  Neurological: Positive for headaches. Negative for dizziness and light-headedness.     Physical Exam Triage Vital Signs ED Triage Vitals  Enc Vitals Group     BP 05/11/18 0825 123/78     Pulse Rate 05/11/18 0825 (!) 101     Resp 05/11/18 0825 16     Temp 05/11/18 0825 98.7 F (37.1 C)     Temp Source 05/11/18 0825 Oral     SpO2 05/11/18 0825 99 %     Weight --      Height --      Head Circumference --      Peak Flow --      Pain Score 05/11/18 0829 5     Pain Loc --      Pain Edu? --      Excl. in [place]? --    No data found.  Updated Vital Signs BP 123/78   Pulse (!) 101   Temp 98.7 F (37.1 C) (Oral)   Resp 16   LMP 05/02/2018   SpO2 99%   Visual Acuity Right Eye Distance:   Left Eye Distance:   Bilateral Distance:    Right Eye Near:   Left Eye Near:    Bilateral Near:     Physical Exam Vitals signs and nursing note reviewed.  Constitutional:      General: She is not in acute distress.    Appearance: She is well-developed.  HENT:     Head: Normocephalic and atraumatic.     Ears:     Comments: Right TM appears slightly erythematous to inferior aspect, but good cone of light and is not bulging  Left TM nonerythematous    Nose:     Comments: Nasal mucosa erythematous, no rhinorrhea    Mouth/Throat:     Comments: Oral mucosa pink and moist, no tonsillar enlargement or exudate. Posterior pharynx patent and nonerythematous, no uvula deviation or swelling. Normal phonation. Eyes:     Conjunctiva/sclera: Conjunctivae normal.  Neck:     Musculoskeletal: Neck supple.    Cardiovascular:     Rate and Rhythm: Normal rate and regular rhythm.     Heart sounds: No murmur.  Pulmonary:     Effort: Pulmonary effort is normal. No respiratory distress.     Breath sounds: Normal breath sounds.     Comments: Mild faint expiratory wheezing that cleared up after bronchospasm/cough, otherwise CTA BL Abdominal:     Palpations: Abdomen is soft.     Tenderness:  There is no abdominal tenderness.  Skin:    General: Skin is warm and dry.  Neurological:     Mental Status: She is alert.      UC Treatments / Results  Labs (all labs ordered are listed, but only abnormal results are displayed) Labs Reviewed - No data to display  EKG None  Radiology No results found.  Procedures Procedures (including critical care time)  Medications Ordered in UC Medications - No data to display  Initial Impression / Assessment and Plan / UC Course  I have reviewed the triage vital signs and the nursing notes.  Pertinent labs & imaging results that were available during my care of the patient were reviewed by me and considered in my medical decision making (see chart for details).     We will treat for bronchitis given wheezing clearing with coughing.  Exam otherwise normal.  Recommendations below.  Patient states that she has tolerated azithromycin before, but causes nausea and vomiting.  Zofran prior to taking.  Continue to monitor,Discussed strict return precautions. Patient verbalized understanding and is agreeable with plan.  Final Clinical Impressions(s) / UC Diagnoses   Final diagnoses:  Acute bronchitis, unspecified organism     Discharge Instructions     I am treating you for bronchitis Begin prednisone daily with food  Albuterol inhaler as needed for shortness of breath, wheezing, chest tightness Azithromycin as prescribed Zofran 30 min prior to medicines or as needed every 8 hours for nausea/vomiting Daily cetirizine to help with congestion and drainage-  may get generic of zyrtec or claritin over the counter Hycodan at night time  Follow up if symptoms not improving, worsening, developing fever, increased shortness of breath difficulty breathing, chest discomfort, persistent symptoms without improvement    ED Prescriptions    Medication Sig Dispense Auth. Provider   ondansetron (ZOFRAN ODT) 4 MG disintegrating tablet Take 1 tablet (4 mg total) by mouth every 8 (eight) hours as needed for nausea or vomiting. 20 tablet Kinzie Wickes C, PA-C   azithromycin (ZITHROMAX) 250 MG tablet Take 1 tablet (250 mg total) by mouth daily. Take first 2 tablets together, then 1 every day until finished. 6 tablet Decari Duggar C, PA-C   albuterol (PROVENTIL HFA;VENTOLIN HFA) 108 (90 Base) MCG/ACT inhaler Inhale 1-2 puffs into the lungs every 6 (six) hours as needed for wheezing or shortness of breath. 1 Inhaler Curley Hogen C, PA-C   Cetirizine HCl 10 MG CAPS Take 1 capsule (10 mg total) by mouth daily for 10 days. 10 capsule Wilhelmena Zea C, PA-C   HYDROcodone-homatropine (HYCODAN) 5-1.5 MG/5ML syrup Take 5 mLs by mouth at bedtime as needed for cough. 60 mL Dominque Marlin C, PA-C   predniSONE (DELTASONE) 50 MG tablet Take 1 tablet (50 mg total) by mouth daily for 5 days. 5 tablet Chevie Birkhead C, PA-C     Controlled Substance Prescriptions [place] Controlled Substance Registry consulted? Yes, I have consulted the [place] Controlled Substances Registry for this patient, and feel the risk/benefit ratio today is favorable for proceeding with this prescription for a controlled substance.   Janith Lima, [place] 05/11/18 [phone]

## 2018-05-11 NOTE — Discharge Instructions (Signed)
I am treating you for bronchitis Begin prednisone daily with food  Albuterol inhaler as needed for shortness of breath, wheezing, chest tightness Azithromycin as prescribed Zofran 30 min prior to medicines or as needed every 8 hours for nausea/vomiting Daily cetirizine to help with congestion and drainage- may get generic of zyrtec or claritin over the counter Hycodan at night time  Follow up if symptoms not improving, worsening, developing fever, increased shortness of breath difficulty breathing, chest discomfort, persistent symptoms without improvement

## 2018-05-11 NOTE — ED Triage Notes (Signed)
Pt presents with persistent cough X 6 days with colored mucus. Pt has had no relief with rest and OTC medication.

## 2018-10-11 ENCOUNTER — Telehealth: Payer: BLUE CROSS/BLUE SHIELD | Admitting: Physician Assistant

## 2018-10-11 DIAGNOSIS — R197 Diarrhea, unspecified: Secondary | ICD-10-CM

## 2018-10-11 DIAGNOSIS — R509 Fever, unspecified: Secondary | ICD-10-CM

## 2018-10-11 NOTE — Progress Notes (Signed)
E-Visit for Corona Virus Screening   Your current symptoms could be consistent with the coronavirus.  Call your health care provider or local health department to request and arrange formal testing. Many health care providers can now test patients at their office but not all are.  Please quarantine yourself while awaiting your test results.  [place] [phone], [place], [address] [phone] or visit [email or web address]  and You have been enrolled in [place] for COVID-19.  Daily you will receive a questionnaire within the [facility] website. Our COVID-19 response team will be monitoring your responses daily.    COVID-19 is a respiratory illness with symptoms that aresimilar to the flu. Symptoms are typically mild to moderate, but there have been cases of severe illness and death due to the virus. The following symptoms may appear 2-14 days after exposure: Fever Cough Shortness of breath or difficulty breathing Chills Repeated shaking with chills Muscle pain Headache Sore throat New loss of taste or smell Fatigue Congestion or runny nose Nausea or vomiting Diarrhea  It is vitally important that if you feel that you have an infection such as this virus or any other virus that you stay home and away from places where you may spread it to others.  You should self-quarantine for 14 days if you have symptoms that could potentially be coronavirus or have been in close contact a with a person diagnosed with COVID-19 within the last 2 weeks. You should avoid contact with people age 42 and older.   You should wear a mask or cloth face covering over your nose and mouth if you must be around other people or animals, including pets (even at home). Try to stay at least 6 feet away from other people. This will protect the people around you.  You  can use Imodium over-the-counter for diarrhea.  Take 2 tabs once and then 1 tab for every loose stool thereafter.  Do not take more than 4 tabs in a day.  You may also take acetaminophen (Tylenol) as needed for fever.   Reduce your risk of any infection by using the same precautions used for avoiding the common cold or flu: Wash your hands often with soap and warm water for at least 20 seconds.  If soap and water are not readily available, use an alcohol-based hand sanitizer with at least 60% alcohol.  If coughing or sneezing, cover your mouth and nose by coughing or sneezing into the elbow areas of your shirt or coat, into a tissue or into your sleeve (not your hands). Avoid shaking hands with others and consider head nods or verbal greetings only.  Avoid touching your eyes,nose, or mouth with unwashed hands. Avoid close contact with people who are sick. Avoid places or events with large numbers of people in one location, like concerts or sporting events. Carefully consider travel plans you have or are making. If you are planning any travel outside or inside the [place], visit the CDC'sTravelers' Health webpagefor the latest health notices. If you have some symptoms but not all symptoms, continue to monitor at home and seek medical attention if your symptoms worsen. If you are having a medical emergency, call 911.  HOME CARE Only take medications as instructed by your medical team. Drink plenty of fluids and get plenty of rest. A steam or ultrasonic humidifier can help if you have congestion.   GET HELP RIGHT AWAY IF YOU HAVE EMERGENCY WARNING SIGNS** FOR COVID-19.  If you or someone is showing any of these signs seek emergency medical care immediately. Call 911 or proceed to your closest emergency facility if: You develop worsening high fever. Trouble breathing Bluish lips or face Persistent pain or pressure in the chest New confusion Inability to wake or stay awake You cough up  blood. Your symptoms become more severe  **This list is not all possible symptoms. Contact your medical provider for any symptoms that are sever or concerning to you.   MAKE SURE YOU  Understand these instructions. Will watch your condition. Will get help right away if you are not doing well or get worse.  Your e-visit answers were reviewed by a board certified advanced clinical practitioner to complete your personal care plan.  Depending on the condition, your plan could have included both over the counter or prescription medications.  If there is a problem please reply once you have received a response from your provider.  Your safety is important to [place].  If you have drug allergies check your prescription carefully.    You can use MyChart to ask questions about today's visit, request a non-urgent call back, or ask for a work or school excuse for 24 hours related to this e-Visit. If it has been greater than 24 hours you will need to follow up with your provider, or enter a new e-Visit to address those concerns. You will get an e-mail in the next two days asking about your experience.  I hope that your e-visit has been valuable and will speed your recovery. Thank you for using e-visits.    ===View-only below this line===   ----- Message -----    From: [name]    Sent: 10/11/2018 10:42 AM EDT      To: E-Visit Mailing List Subject: CoronaVirus (ZOXWR-60) Screening  CoronaVirus (AVWUJ-81) Screening --------------------------------  Question: Do you have any of the following?  Answer:   Fever  Question: Do you have any of the following additional symptoms?  Answer:   Diarrhea  Question: Have you had a fever? Answer:   Yes  Question: If you are running a fever, please type in your temperature reading Answer:   99  Question: How long have you had the fever? Answer:   For a week  Question: Have others in your home or workplace had similar symptoms? Answer:    No  Question: When did your symptoms start? Answer:   Last Wednesday  Question: Have you recently visited any of the following countries? Answer:   None of these  Question: If you have traveled anywhere in the last  2 months please document where you have visited: Answer:     Question: Have you recently been around others from these countries or visited these countries who have had coughing or fever? Answer:   No  Question: Have you recently been around anyone who has been diagnosed with Corona virus? Answer:   No  Question: Have you been taking any medications? Answer:   Yes  Question: If taking medications for these symptoms, please list the names and whether they are helping or not Answer:   zanax  Question: Are you treated for any of the following conditions: Asthma, COPD, Diabetes, Renal Failure (on Dialysis), AIDS, any Neuromuscular disease that effects the clearing of secretions, Heart Failure, or Heart Disease? Answer:   No  Question: Please enter a phone number where you can be reached if we have additional questions about your symptoms Answer:   [phone]  Question: Please list your medication allergies that you may have ? (If 'none' , please list as 'none') Answer:   Biaxon, Tamiflu, some antiotics make me nauseous  Question: Please list any additional comments  Answer:     Question: Are you pregnant? Answer:   I am confident that I am not pregnant  Question: Are you breastfeeding? Answer:   No  A total of 5-10 minutes was spent evaluating this patients questionnaire and formulating a plan of care.

## 2018-10-12 ENCOUNTER — Telehealth: Payer: Self-pay | Admitting: *Deleted

## 2018-10-12 ENCOUNTER — Other Ambulatory Visit: Payer: Self-pay

## 2018-10-12 ENCOUNTER — Encounter (HOSPITAL_COMMUNITY): Payer: Self-pay

## 2018-10-12 ENCOUNTER — Ambulatory Visit (HOSPITAL_COMMUNITY)
Admission: EM | Admit: 2018-10-12 | Discharge: 2018-10-12 | Disposition: A | Discharge status: Home / Self Care | Payer: BC Managed Care – PPO | Attending: Emergency Medicine | Admitting: Emergency Medicine

## 2018-10-12 DIAGNOSIS — Z20822 Contact with and (suspected) exposure to covid-19: Secondary | ICD-10-CM

## 2018-10-12 DIAGNOSIS — K219 Gastro-esophageal reflux disease without esophagitis: Secondary | ICD-10-CM | POA: Diagnosis not present

## 2018-10-12 DIAGNOSIS — R110 Nausea: Secondary | ICD-10-CM | POA: Diagnosis not present

## 2018-10-12 MED ORDER — OMEPRAZOLE 20 MG PO CPDR: 20.0000 mg | DELAYED_RELEASE_CAPSULE | Freq: Every day | ORAL | 0 refills | Status: DC

## 2018-10-12 MED ORDER — ALUM & MAG HYDROXIDE-SIMETH 200-200-20 MG/5ML PO SUSP: 15.0000 mL | Freq: Four times a day (QID) | ORAL | 0 refills | Status: DC | PRN

## 2018-10-12 MED ORDER — ONDANSETRON 4 MG PO TBDP: 4.0000 mg | ORAL_TABLET | Freq: Three times a day (TID) | ORAL | 0 refills | Status: DC | PRN

## 2018-10-12 NOTE — Telephone Encounter (Signed)
Scheduled for covid testing today @ [facility] @ 9:45. Instructions given and order placed

## 2018-10-12 NOTE — Discharge Instructions (Signed)
Someone will be in contact with you about repeat COVID testing- Address is [place]  Please use Zofran as needed for nausea and vomiting Begin taking omeprazole/Prilosec daily for the next 2 weeks May supplement with Maalox or Tums as needed for further heartburn/indigestion  Please continue to monitor your symptoms, bowel movements, temperature and abdominal pain, symptoms progressing, worsening, not resolving with the above in time please follow-up

## 2018-10-12 NOTE — Telephone Encounter (Signed)
-----   Message from Janith Lima, PA-C sent at 10/12/2018  9:09 AM EDT ----- Regarding: Needs COVID testing Low grade fevers x 1 week, coughing initially, persistent nausea; no known exposure

## 2018-10-12 NOTE — ED Triage Notes (Signed)
Pt presents with ongoing fever, diarrhea, heartburn, and nausea X 7 days.

## 2018-10-12 NOTE — ED Provider Notes (Signed)
Kyle    CSN: [id number] Arrival date & time: 10/12/18  0808      History   Chief Complaint Chief Complaint  Patient presents with   Nausea   Gastroesophageal Reflux   Diarrhea    HPI Donna Spencer is a 33 y.o. female history of anxiety, presenting today for evaluation of low-grade fevers, heartburn and nausea.  Patient states that over the past 7 days she has noticed low-grade fevers mainly at nighttime.  Highest has been around 100.  She states that she initially had a cough, but this is resolved.  Denies congestion or sore throat.  Denies body aches or chills.  Her most prominent symptom has been some nausea with associated heartburn.  Occasional chest discomfort related to this.  She has been using Pepcid as needed as well as Tums.  She has had intermittent loose stools which have not been daily.  Denies blood in the stool.  Denies abdominal pain.  She did recently have COVID testing at [facility], but patient reports that this was a self swab and did not go high into the nose.  Denies any close sick contacts.  Denies any known exposure to COVID.  HPI  Past Medical History:  Diagnosis Date   Allergy    Anal fissure    Anemia    Anxiety    Benign hematuria    Mouth ulcers    recurrent, apthous    Patient Active Problem List   Diagnosis Date Noted   Obesity 01/26/2014   Uses oral contraceptives 01/26/2014   Anxiety    Anal fissure    Benign hematuria    Mouth ulcers     Past Surgical History:  Procedure Laterality Date   REPAIR ANKLE LIGAMENT Right 2002    OB History   No obstetric history on file.      Home Medications    Prior to Admission medications   Medication Sig Start Date End Date Taking? Authorizing Provider  Adapalene-Benzoyl Peroxide (EPIDUO) 0.1-2.5 % gel Apply topically at bedtime.    [provider]  albuterol (PROVENTIL HFA;VENTOLIN HFA) 108 (90 Base) MCG/ACT inhaler Inhale 1-2 puffs into the lungs  every 6 (six) hours as needed for wheezing or shortness of breath. 05/11/18   Stormi Vandevelde C, PA-C  alum & mag hydroxide-simeth (MAALOX/MYLANTA) 200-200-20 MG/5ML suspension Take 15 mLs by mouth every 6 (six) hours as needed for indigestion or heartburn. 10/12/18   Anaka Beazer C, PA-C  amphetamine-dextroamphetamine (ADDERALL XR) 30 MG 24 hr capsule Take 30 mg by mouth every morning.    [provider]  Cetirizine HCl 10 MG CAPS Take 1 capsule (10 mg total) by mouth daily for 10 days. 05/11/18 05/21/18  Zyir Gassert C, PA-C  fluticasone (FLONASE) 50 MCG/ACT nasal spray Place 2 sprays into both nostrils daily. 02/21/16   Weber, Damaris Hippo, PA-C  metFORMIN (GLUCOPHAGE) 500 MG tablet Take by mouth 2 (two) times daily with a meal.    [provider]  norgestimate-ethinyl estradiol (ORTHO-CYCLEN,SPRINTEC,PREVIFEM) 0.25-35 MG-MCG tablet Take 1 tablet by mouth daily. 11/26/15   Harrison Mons, PA  omeprazole (PRILOSEC) 20 MG capsule Take 1 capsule (20 mg total) by mouth daily for 14 days. 10/12/18 10/26/18  Valree Feild C, PA-C  ondansetron (ZOFRAN ODT) 4 MG disintegrating tablet Take 1 tablet (4 mg total) by mouth every 8 (eight) hours as needed for nausea or vomiting. 10/12/18   Joneen Caraway, Elesa Hacker, PA-C    Family History Family History  Problem  Relation Age of Onset   Hypertension Mother    Cancer Maternal Grandmother    Breast cancer Maternal Grandmother    GER disease Paternal Grandmother    COPD Paternal Grandfather     Social History Social History   Tobacco Use   Smoking status: Never Smoker   Smokeless tobacco: Never Used  Substance Use Topics   Alcohol use: No    Comment: occasionally - couple times/month (liquor)   Drug use: No     Allergies   Clarithromycin, Codeine, Dairy aid [lactase], Flagyl [metronidazole], and Oseltamivir phosphate   Review of Systems Review of Systems  Constitutional: Positive for fever. Negative for activity change,  appetite change, chills and fatigue.  HENT: Negative for congestion, ear pain, rhinorrhea, sinus pressure, sore throat and trouble swallowing.   Eyes: Negative for discharge and redness.  Respiratory: Negative for cough, chest tightness and shortness of breath.   Cardiovascular: Negative for chest pain.  Gastrointestinal: Positive for diarrhea and nausea. Negative for abdominal pain and vomiting.  Musculoskeletal: Negative for myalgias.  Skin: Negative for rash.  Neurological: Negative for dizziness, light-headedness and headaches.     Physical Exam Triage Vital Signs ED Triage Vitals  Enc Vitals Group     BP 10/12/18 0842 105/80     Pulse Rate 10/12/18 0842 80     Resp 10/12/18 0842 17     Temp 10/12/18 0842 99 F (37.2 C)     Temp Source 10/12/18 0842 Oral     SpO2 10/12/18 0842 100 %     Weight --      Height --      Head Circumference --      Peak Flow --      Pain Score 10/12/18 0843 3     Pain Loc --      Pain Edu? --      Excl. in [place]? --    No data found.  Updated Vital Signs BP 105/80 (BP Location: Left Arm)    Pulse 80    Temp 99 F (37.2 C) (Oral)    Resp 17    LMP 09/18/2018    SpO2 100%   Visual Acuity Right Eye Distance:   Left Eye Distance:   Bilateral Distance:    Right Eye Near:   Left Eye Near:    Bilateral Near:     Physical Exam Vitals signs and nursing note reviewed.  Constitutional:      General: She is not in acute distress.    Appearance: She is well-developed.  HENT:     Head: Normocephalic and atraumatic.     Ears:     Comments: Bilateral ears without tenderness to palpation of external auricle, tragus and mastoid, EAC's without erythema or swelling, TM's with good bony landmarks and cone of light. Non erythematous.     Mouth/Throat:     Comments: Oral mucosa pink and moist, no tonsillar enlargement or exudate. Posterior pharynx patent and nonerythematous, no uvula deviation or swelling. Normal phonation. Eyes:      Conjunctiva/sclera: Conjunctivae normal.     Comments: Wearing glasses  Neck:     Musculoskeletal: Neck supple.  Cardiovascular:     Rate and Rhythm: Normal rate and regular rhythm.     Heart sounds: No murmur.  Pulmonary:     Effort: Pulmonary effort is normal. No respiratory distress.     Breath sounds: Normal breath sounds.     Comments: Breathing comfortably at rest, CTABL, no wheezing, rales or  other adventitious sounds auscultated Abdominal:     Palpations: Abdomen is soft.     Tenderness: There is no abdominal tenderness.     Comments: Soft, nondistended, nontender light deep palpation throughout entire abdomen, no focal tenderness, negative rebound  Skin:    General: Skin is warm and dry.  Neurological:     General: No focal deficit present.     Mental Status: She is alert and oriented to person, place, and time.      UC Treatments / Results  Labs (all labs ordered are listed, but only abnormal results are displayed) Labs Reviewed - No data to display  EKG None  Radiology No results found.  Procedures Procedures (including critical care time)  Medications Ordered in UC Medications - No data to display  Initial Impression / Assessment and Plan / UC Course  I have reviewed the triage vital signs and the nursing notes.  Pertinent labs & imaging results that were available during my care of the patient were reviewed by me and considered in my medical decision making (see chart for details).     Patient with concern over cause of her symptoms.  Reports low-grade fevers mainly in the 99-100 range.  Minimal respiratory symptoms.  Mainly nausea and reflux.  Denies significant symptoms currently, will defer GI cocktail.  Will recommend initiating trial of omeprazole over the next 2 weeks, may supplement with Maalox/Tums as needed.  Zofran as needed for nausea.  Diarrhea is infrequent, do not suspect underlying infectious cause.  Continue to monitor.  Do not suspect  underlying abdominal emergency.  Will repeat COVID testing to ensure symptoms not related to this.  Continue to rest and drink plenty of fluids.  Vital signs stable.Discussed strict return precautions. Patient verbalized understanding and is agreeable with plan.  Final Clinical Impressions(s) / UC Diagnoses   Final diagnoses:  Nausea  Gastroesophageal reflux disease, esophagitis presence not specified     Discharge Instructions     Someone will be in contact with you about repeat COVID testing- Address is [place]  Please use Zofran as needed for nausea and vomiting Begin taking omeprazole/Prilosec daily for the next 2 weeks May supplement with Maalox or Tums as needed for further heartburn/indigestion  Please continue to monitor your symptoms, bowel movements, temperature and abdominal pain, symptoms progressing, worsening, not resolving with the above in time please follow-up   ED Prescriptions    Medication Sig Dispense Auth. Provider   ondansetron (ZOFRAN ODT) 4 MG disintegrating tablet Take 1 tablet (4 mg total) by mouth every 8 (eight) hours as needed for nausea or vomiting. 20 tablet Wil Slape C, PA-C   omeprazole (PRILOSEC) 20 MG capsule Take 1 capsule (20 mg total) by mouth daily for 14 days. 14 capsule Maripaz Mullan C, PA-C   alum & mag hydroxide-simeth (MAALOX/MYLANTA) 200-200-20 MG/5ML suspension Take 15 mLs by mouth every 6 (six) hours as needed for indigestion or heartburn. 355 mL Takina Busser C, PA-C     Controlled Substance Prescriptions [place] Controlled Substance Registry consulted? Not Applicable   Janith Lima, [place] 10/12/18 [phone]

## 2018-10-15 LAB — NOVEL CORONAVIRUS, NAA: SARS-CoV-2, NAA: NOT DETECTED

## 2018-10-25 ENCOUNTER — Other Ambulatory Visit (HOSPITAL_COMMUNITY): Payer: Self-pay | Admitting: Gastroenterology

## 2018-10-25 ENCOUNTER — Other Ambulatory Visit: Payer: Self-pay | Admitting: Gastroenterology

## 2018-10-25 DIAGNOSIS — R933 Abnormal findings on diagnostic imaging of other parts of digestive tract: Secondary | ICD-10-CM

## 2018-10-27 ENCOUNTER — Other Ambulatory Visit: Payer: Self-pay

## 2018-10-27 ENCOUNTER — Ambulatory Visit (HOSPITAL_COMMUNITY)
Admission: RE | Admit: 2018-10-27 | Discharge: 2018-10-27 | Disposition: A | Discharge status: Home / Self Care | Payer: BC Managed Care – PPO | Source: Ambulatory Visit | Attending: Gastroenterology | Admitting: Gastroenterology

## 2018-10-27 DIAGNOSIS — R933 Abnormal findings on diagnostic imaging of other parts of digestive tract: Secondary | ICD-10-CM | POA: Diagnosis not present

## 2018-10-27 MED ORDER — GADOBUTROL 1 MMOL/ML IV SOLN
9.0000 mL | Freq: Once | INTRAVENOUS | Status: AC | PRN
  Administered 2018-10-27: 9 mL via INTRAVENOUS

## 2018-11-01 ENCOUNTER — Other Ambulatory Visit: Payer: Self-pay | Admitting: Gastroenterology

## 2018-11-01 DIAGNOSIS — R9389 Abnormal findings on diagnostic imaging of other specified body structures: Secondary | ICD-10-CM

## 2018-12-25 DIAGNOSIS — Z20828 Contact with and (suspected) exposure to other viral communicable diseases: Secondary | ICD-10-CM | POA: Diagnosis not present

## 2018-12-26 DIAGNOSIS — Z6841 Body Mass Index (BMI) 40.0 and over, adult: Secondary | ICD-10-CM | POA: Diagnosis not present

## 2018-12-26 DIAGNOSIS — Z03818 Encounter for observation for suspected exposure to other biological agents ruled out: Secondary | ICD-10-CM | POA: Diagnosis not present

## 2018-12-26 DIAGNOSIS — R509 Fever, unspecified: Secondary | ICD-10-CM | POA: Diagnosis not present

## 2019-02-21 DIAGNOSIS — Z23 Encounter for immunization: Secondary | ICD-10-CM | POA: Diagnosis not present

## 2019-03-17 DIAGNOSIS — Z20828 Contact with and (suspected) exposure to other viral communicable diseases: Secondary | ICD-10-CM | POA: Diagnosis not present

## 2019-06-23 ENCOUNTER — Ambulatory Visit: Payer: BC Managed Care – PPO | Attending: Internal Medicine

## 2019-06-23 DIAGNOSIS — Z23 Encounter for immunization: Secondary | ICD-10-CM

## 2019-06-23 NOTE — Progress Notes (Signed)
   Covid-19 Vaccination Clinic  Name:  Donna Spencer    MRN: [id number] DOB: 06/03/1985  06/23/2019  [name] was observed post Covid-19 immunization for 15 minutes without incidence. She was provided with Vaccine Information Sheet and instruction to access the V-Safe system.   [name] was instructed to call 911 with any severe reactions post vaccine: Marland Kitchen Difficulty breathing  . Swelling of your face and throat  . A fast heartbeat  . A bad rash all over your body  . Dizziness and weakness    Immunizations Administered    Name Date Dose VIS Date Route   Pfizer COVID-19 Vaccine 06/23/2019  8:58 AM 0.3 mL 04/07/2019 Intramuscular   Manufacturer: [place]   Lot: [id number]   [place]: [id number]

## 2019-07-18 ENCOUNTER — Ambulatory Visit (INDEPENDENT_AMBULATORY_CARE_PROVIDER_SITE_OTHER): Payer: 59 | Admitting: Family Medicine

## 2019-07-18 ENCOUNTER — Other Ambulatory Visit: Payer: Self-pay

## 2019-07-18 ENCOUNTER — Ambulatory Visit: Payer: BC Managed Care – PPO

## 2019-07-18 ENCOUNTER — Encounter: Payer: Self-pay | Admitting: Family Medicine

## 2019-07-18 VITALS — BP 114/72 | HR 81 | Temp 98.3°F | Ht 59.0 in | Wt 210.0 lb

## 2019-07-18 DIAGNOSIS — Z0001 Encounter for general adult medical examination with abnormal findings: Secondary | ICD-10-CM | POA: Diagnosis not present

## 2019-07-18 DIAGNOSIS — Z1329 Encounter for screening for other suspected endocrine disorder: Secondary | ICD-10-CM

## 2019-07-18 DIAGNOSIS — Z13220 Encounter for screening for lipoid disorders: Secondary | ICD-10-CM | POA: Diagnosis not present

## 2019-07-18 DIAGNOSIS — Z130 Encounter for screening for diseases of the blood and blood-forming organs and certain disorders involving the immune mechanism: Secondary | ICD-10-CM | POA: Diagnosis not present

## 2019-07-18 DIAGNOSIS — Z13228 Encounter for screening for other metabolic disorders: Secondary | ICD-10-CM | POA: Diagnosis not present

## 2019-07-18 DIAGNOSIS — Z0000 Encounter for general adult medical examination without abnormal findings: Secondary | ICD-10-CM

## 2019-07-18 DIAGNOSIS — D75839 Thrombocytosis, unspecified: Secondary | ICD-10-CM

## 2019-07-18 DIAGNOSIS — D72829 Elevated white blood cell count, unspecified: Secondary | ICD-10-CM

## 2019-07-18 DIAGNOSIS — D473 Essential (hemorrhagic) thrombocythemia: Secondary | ICD-10-CM

## 2019-07-18 NOTE — Progress Notes (Signed)
3/23/20212:51 PM  Donna Spencer 23-Mar-1986, 34 y.o., female [id number]  Chief Complaint  Patient presents with  . transfer of care    annual exam if allows     HPI:   Patient is a 34 y.o. female who presents today for CPE  Has been most recent being seen at [facility], last OV almost a year ago  H/o MRI liver - managed by Dr Benson [place] IMPRESSION: 1. 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 No overtly suspicious features. Recommend follow-up MRI abdomen without and with IV Eovist contrast in 6 months. 2. Numerous liver hemangiomas. 3. Background marked diffuse hepatic steatosis.  Cervical Cancer Screening: has upcoming appt with obgyn next month Currently trying to conceive LMP 06/24/2019 Breast Cancer Screening: at age 74 Colorectal Cancer Screening: at age 51 Bone Density Testing: at age 71 HIV Screening: declines STI Screening: declines Seasonal Influenza Vaccination: UTD Td/Tdap Vaccination: UTD Covid vaccine: completed series per patient Frequency of Dental evaluation: Q6 months Frequency of Eye evaluation: yearly, wears eyeglasses  Most Recent Immunizations  Administered Date(s) Administered  . Hepatitis A 02/27/2011  . PFIZER SARS-COV-2 Vaccination 06/23/2019  . Tdap 04/06/2011    Depression screen [facility] 2/9 07/18/2019 02/21/2016 11/26/2015  Decreased Interest 0 0 0  Down, Depressed, Hopeless 0 0 0  PHQ - 2 Score 0 0 0    Fall Risk  07/18/2019 02/21/2016 11/26/2015  Falls in the past year? 0 No No  Number falls in past yr: 0 - -  Injury with Fall? 0 - -  Follow up Falls evaluation completed - -     Allergies  Allergen Reactions  . Biaxin [Clarithromycin]     Reaction unknown to patient, reported as a baby  . Clarithromycin   . Codeine   . Dairy Aid [Lactase] Swelling    Avoid dairy all dairy products  .  Flagyl [Metronidazole] Nausea And Vomiting and Other (See Comments)    Fever  . Oseltamivir Phosphate     Lesion on genitals    Prior to Admission medications   Medication Sig Start Date End Date Taking? Authorizing Provider  Adapalene-Benzoyl Peroxide (EPIDUO) 0.1-2.5 % gel Apply topically at bedtime.    [provider]  albuterol (PROVENTIL HFA;VENTOLIN HFA) 108 (90 Base) MCG/ACT inhaler Inhale 1-2 puffs into the lungs every 6 (six) hours as needed for wheezing or shortness of breath. Patient not taking: Reported on 07/18/2019 05/11/18   Wieters, Madelynn Done C, PA-C  alum & mag hydroxide-simeth (MAALOX/MYLANTA) 200-200-20 MG/5ML suspension Take 15 mLs by mouth every 6 (six) hours as needed for indigestion or heartburn. Patient not taking: Reported on 07/18/2019 10/12/18   Wieters, Hallie C, PA-C  amphetamine-dextroamphetamine (ADDERALL XR) 30 MG 24 hr capsule Take 30 mg by mouth every morning.    [provider]  Cetirizine HCl 10 MG CAPS Take 1 capsule (10 mg total) by mouth daily for 10 days. 05/11/18 05/21/18  Wieters, Hallie C, PA-C  fluticasone (FLONASE) 50 MCG/ACT nasal spray Place 2 sprays into both nostrils daily. Patient not taking: Reported on 07/18/2019 02/21/16   Mancel Bale, PA-C  influenza vaccine (FLUCELVAX QUADRIVALENT) 0.5 ML injection Flucelvax Quad 2020-2021 (PF) 60 mcg (15 mcg x 4)/0.5 mL IM syringe  PHARMACY ADMINISTERED    [provider]  metFORMIN (GLUCOPHAGE) 500 MG tablet Take by mouth 2 (two) times daily with a meal.    [provider]  norgestimate-ethinyl estradiol (ORTHO-CYCLEN,SPRINTEC,PREVIFEM) 0.25-35 MG-MCG tablet Take 1 tablet by mouth daily. Patient not taking: Reported on 07/18/2019 11/26/15   Harrison Mons, PA  omeprazole (PRILOSEC) 20 MG capsule Take 1 capsule (20 mg total) by mouth daily for 14 days. 10/12/18 10/26/18  Wieters, Hallie C, PA-C  ondansetron (ZOFRAN ODT) 4 MG disintegrating tablet Take 1 tablet (4 mg total) by mouth  every 8 (eight) hours as needed for nausea or vomiting. Patient not taking: Reported on 07/18/2019 10/12/18   Janith Lima, PA-C    Past Medical History:  Diagnosis Date  . Allergy   . Anal fissure   . Anemia   . Anxiety   . Benign hematuria   . Mouth ulcers    recurrent, apthous    Past Surgical History:  Procedure Laterality Date  . REPAIR ANKLE LIGAMENT Right 2002    Social History   Tobacco Use  . Smoking status: Never Smoker  . Smokeless tobacco: Never Used  Substance Use Topics  . Alcohol use: No    Comment: occasionally - couple times/month (liquor)    Family History  Problem Relation Age of Onset  . Hypertension Mother   . Cancer Maternal Grandmother   . Breast cancer Maternal Grandmother   . GER disease Paternal Grandmother   . COPD Paternal Grandfather     Review of Systems  Constitutional: Positive for fever (after covid vaccine). Negative for chills.  HENT: Positive for congestion.   Respiratory: Positive for cough. Negative for shortness of breath and wheezing.   Cardiovascular: Negative for chest pain, palpitations and leg swelling.  Gastrointestinal: Negative for abdominal pain, blood in stool, melena, nausea and vomiting.  Genitourinary: Positive for hematuria (h/o microscopic with beingn workup per patient). Negative for dysuria.  Endo/Heme/Allergies: Positive for environmental allergies.  All other systems reviewed and are negative.    OBJECTIVE:  Today's Vitals   07/18/19 1435  BP: 114/72  Pulse: 81  Temp: 98.3 F (36.8 C)  SpO2: 98%  Weight: 210 lb (95.3 kg)  Height: 4\' 11"  (1.499 m)   Body mass index is 42.41 kg/m.   Physical Exam Vitals and nursing note reviewed.  Constitutional:      Appearance: She is well-developed.  HENT:     Head: Normocephalic and atraumatic.     Right Ear: Hearing, tympanic membrane, ear canal and external ear normal.     Left Ear: Hearing, tympanic membrane, ear canal and external ear normal.       Mouth/Throat:     Mouth: Mucous membranes are moist.     Pharynx: No oropharyngeal exudate or posterior oropharyngeal erythema.  Eyes:     Extraocular Movements: Extraocular movements intact.     Conjunctiva/sclera: Conjunctivae normal.     Pupils: Pupils are equal, round, and reactive to light.  Neck:     Thyroid: No thyromegaly.  Cardiovascular:     Rate and Rhythm: Normal rate and regular rhythm.     Heart sounds: Normal heart sounds. No murmur. No friction rub. No gallop.   Pulmonary:     Effort: Pulmonary effort is normal.     Breath sounds: Normal breath sounds. No wheezing, rhonchi or rales.  Abdominal:     General: Bowel sounds are normal. There is no distension.     Palpations: Abdomen is soft. There is no hepatomegaly, splenomegaly or mass.     Tenderness: There is no abdominal tenderness.  Musculoskeletal:        General: Normal range  of motion.     Cervical back: Neck supple.     Right lower leg: No edema.     Left lower leg: No edema.  Lymphadenopathy:     Cervical: No cervical adenopathy.  Skin:    General: Skin is warm and dry.  Neurological:     Mental Status: She is alert and oriented to person, place, and time.     Cranial Nerves: No cranial nerve deficit.     Gait: Gait normal.     Deep Tendon Reflexes: Reflexes are normal and symmetric.  Psychiatric:        Mood and Affect: Mood normal.        Behavior: Behavior normal.     No results found for this or any previous visit (from the past 24 hour(s)).  No results found.   ASSESSMENT and PLAN  1. Annual physical exam Routine HCM labs ordered. HCM reviewed/discussed. Anticipatory guidance regarding healthy weight, lifestyle and choices given.   2. Screening for deficiency anemia - CBC  3. Screening for metabolic disorder - Comprehensive metabolic panel - Urinalysis, Routine w reflex microscopic  4. Screening for lipid disorders - Lipid panel  5. Screening for thyroid disorder -  TSH  Return in about 1 year (around 07/17/2020).    Rutherford Guys, MD Primary [facility] at [facility], [place] [address] Ph.  [phone] Fax [phone]

## 2019-07-18 NOTE — Patient Instructions (Addendum)
If you have lab work done today you will be contacted with your lab results within the next 2 weeks.  If you have not heard from [place] then please contact us. The fastest way to get your results is to register for My Chart.   IF you received an x-ray today, you will receive an invoice from [facility]. Please contact [facility] at [phone] with questions or concerns regarding your invoice.   IF you received labwork today, you will receive an invoice from [place]. Please contact [facility] at [phone] with questions or concerns regarding your invoice.   Our billing staff will not be able to assist you with questions regarding bills from these companies.  You will be contacted with the lab results as soon as they are available. The fastest way to get your results is to activate your My Chart account. Instructions are located on the last page of this paperwork. If you have not heard from [place] regarding the results in 2 weeks, please contact this office.      Preventive Care 42-47 Years Old, Female Preventive care refers to visits with your health care provider and lifestyle choices that can promote health and wellness. This includes:  A yearly physical exam. This may also be called an annual well check.  Regular dental visits and eye exams.  Immunizations.  Screening for certain conditions.  Healthy lifestyle choices, such as eating a healthy diet, getting regular exercise, not using drugs or products that contain nicotine and tobacco, and limiting alcohol use. What can I expect for my preventive care visit? Physical exam Your health care provider will check your:  Height and weight. This may be used to calculate body mass index (BMI), which tells if you are at a healthy weight.  Heart rate and blood pressure.  Skin for abnormal spots. Counseling Your health care provider may ask you questions about your:  Alcohol, tobacco, and drug use.  Emotional  well-being.  Home and relationship well-being.  Sexual activity.  Eating habits.  Work and work Statistician.  Method of birth control.  Menstrual cycle.  Pregnancy history. What immunizations do I need?  Influenza (flu) vaccine  This is recommended every year. Tetanus, diphtheria, and pertussis (Tdap) vaccine  You may need a Td booster every 10 years. Varicella (chickenpox) vaccine  You may need this if you have not been vaccinated. Human papillomavirus (HPV) vaccine  If recommended by your health care provider, you may need three doses over 6 months. Measles, mumps, and rubella (MMR) vaccine  You may need at least one dose of MMR. You may also need a second dose. Meningococcal conjugate (MenACWY) vaccine  One dose is recommended if you are age 73-21 years and a first-year college student living in a residence hall, or if you have one of several medical conditions. You may also need additional booster doses. Pneumococcal conjugate (PCV13) vaccine  You may need this if you have certain conditions and were not previously vaccinated. Pneumococcal polysaccharide (PPSV23) vaccine  You may need one or two doses if you smoke cigarettes or if you have certain conditions. Hepatitis A vaccine  You may need this if you have certain conditions or if you travel or work in places where you may be exposed to hepatitis A. Hepatitis B vaccine  You may need this if you have certain conditions or if you travel or work in places where you may be exposed to hepatitis B. Haemophilus influenzae type b (Hib) vaccine  You may need this if you have certain conditions. You may receive vaccines as individual doses or as more than one vaccine together in one shot (combination vaccines). Talk with your health care provider about the risks and benefits of combination vaccines. What tests do I need?  Blood tests  Lipid and cholesterol levels. These may be checked every 5 years starting at age  62.  Hepatitis C test.  Hepatitis B test. Screening  Diabetes screening. This is done by checking your blood sugar (glucose) after you have not eaten for a while (fasting).  Sexually transmitted disease (STD) testing.  BRCA-related cancer screening. This may be done if you have a family history of breast, ovarian, tubal, or peritoneal cancers.  Pelvic exam and Pap test. This may be done every 3 years starting at age 69. Starting at age 57, this may be done every 5 years if you have a Pap test in combination with an HPV test. Talk with your health care provider about your test results, treatment options, and if necessary, the need for more tests. Follow these instructions at home: Eating and drinking   Eat a diet that includes fresh fruits and vegetables, whole grains, lean protein, and low-fat dairy.  Take vitamin and mineral supplements as recommended by your health care provider.  Do not drink alcohol if: ? Your health care provider tells you not to drink. ? You are pregnant, may be pregnant, or are planning to become pregnant.  If you drink alcohol: ? Limit how much you have to 0-1 drink a day. ? Be aware of how much alcohol is in your drink. In the [place] one drink equals one 12 oz bottle of beer (355 mL), one 5 oz glass of wine (148 mL), or one 1 oz glass of hard liquor (44 mL). Lifestyle  Take daily care of your teeth and gums.  Stay active. Exercise for at least 30 minutes on 5 or more days each week.  Do not use any products that contain nicotine or tobacco, such as cigarettes, e-cigarettes, and chewing tobacco. If you need help quitting, ask your health care provider.  If you are sexually active, practice safe sex. Use a condom or other form of birth control (contraception) in order to prevent pregnancy and STIs (sexually transmitted infections). If you plan to become pregnant, see your health care provider for a preconception visit. What's next?  Visit your health  care provider once a year for a well check visit.  Ask your health care provider how often you should have your eyes and teeth checked.  Stay up to date on all vaccines. This information is not intended to replace advice given to you by your health care provider. Make sure you discuss any questions you have with your health care provider. Document Revised: 12/23/2017 Document Reviewed: 12/23/2017 Elsevier Patient Education  2020 [employer].

## 2019-07-19 ENCOUNTER — Telehealth: Payer: Self-pay | Admitting: Family Medicine

## 2019-07-19 LAB — CBC
Hematocrit: 39 % (ref 34.0–46.6)
Hemoglobin: 12.9 g/dL (ref 11.1–15.9)
MCH: 29.9 pg (ref 26.6–33.0)
MCHC: 33.1 g/dL (ref 31.5–35.7)
MCV: 90 fL (ref 79–97)
Platelets: 472 10*3/uL — ABNORMAL HIGH (ref 150–450)
RBC: 4.32 x10E6/uL (ref 3.77–5.28)
RDW: 13 % (ref 11.7–15.4)
WBC: 13.3 10*3/uL — ABNORMAL HIGH (ref 3.4–10.8)

## 2019-07-19 LAB — URINALYSIS, ROUTINE W REFLEX MICROSCOPIC
Bilirubin, UA: NEGATIVE
Glucose, UA: NEGATIVE
Ketones, UA: NEGATIVE
Leukocytes,UA: NEGATIVE
Nitrite, UA: NEGATIVE
Protein,UA: NEGATIVE
Specific Gravity, UA: 1.019 (ref 1.005–1.030)
Urobilinogen, Ur: 0.2 mg/dL (ref 0.2–1.0)
pH, UA: 8 — ABNORMAL HIGH (ref 5.0–7.5)

## 2019-07-19 LAB — COMPREHENSIVE METABOLIC PANEL
ALT: 16 IU/L (ref 0–32)
AST: 15 IU/L (ref 0–40)
Albumin/Globulin Ratio: 1.3 (ref 1.2–2.2)
Albumin: 4.2 g/dL (ref 3.8–4.8)
Alkaline Phosphatase: 75 IU/L (ref 39–117)
BUN/Creatinine Ratio: 13 (ref 9–23)
BUN: 10 mg/dL (ref 6–20)
Bilirubin Total: <0.2 mg/dL (ref 0.0–1.2)
CO2: 24 mmol/L (ref 20–29)
Calcium: 9.5 mg/dL (ref 8.7–10.2)
Chloride: 101 mmol/L (ref 96–106)
Creatinine, Ser: 0.8 mg/dL (ref 0.57–1.00)
GFR calc Af Amer: 113 mL/min/{1.73_m2} (ref >59)
GFR calc non Af Amer: 98 mL/min/{1.73_m2} (ref >59)
Globulin, Total: 3.3 g/dL (ref 1.5–4.5)
Glucose: 80 mg/dL (ref 65–99)
Potassium: 4.6 mmol/L (ref 3.5–5.2)
Sodium: 137 mmol/L (ref 134–144)
Total Protein: 7.5 g/dL (ref 6.0–8.5)

## 2019-07-19 LAB — LIPID PANEL
Chol/HDL Ratio: 3.8 ratio (ref 0.0–4.4)
Cholesterol, Total: 193 mg/dL (ref 100–199)
HDL: 51 mg/dL (ref >39)
LDL Chol Calc (NIH): 119 mg/dL — ABNORMAL HIGH (ref 0–99)
Triglycerides: 129 mg/dL (ref 0–149)
VLDL Cholesterol Cal: 23 mg/dL (ref 5–40)

## 2019-07-19 LAB — MICROSCOPIC EXAMINATION
Bacteria, UA: NONE SEEN
Casts: NONE SEEN /lpf
WBC, UA: NONE SEEN /hpf (ref 0–5)

## 2019-07-19 LAB — TSH: TSH: 2.59 u[IU]/mL (ref 0.450–4.500)

## 2019-07-19 NOTE — Telephone Encounter (Signed)
Pt called said she missed a call from the office and is just calling us back. The voice mail that was left had nothing on it. [phone]. Please advise.

## 2019-08-01 NOTE — Addendum Note (Signed)
Addended by: Rutherford Guys on: 08/01/2019 01:08 PM   Modules accepted: Orders

## 2019-08-25 LAB — HM PAP SMEAR: HM Pap smear: NEGATIVE

## 2019-10-16 ENCOUNTER — Encounter (HOSPITAL_COMMUNITY): Payer: Self-pay

## 2019-10-16 ENCOUNTER — Other Ambulatory Visit: Payer: Self-pay

## 2019-10-16 ENCOUNTER — Ambulatory Visit (HOSPITAL_COMMUNITY)
Admission: EM | Admit: 2019-10-16 | Discharge: 2019-10-16 | Disposition: A | Discharge status: Home / Self Care | Payer: No Typology Code available for payment source | Attending: Family Medicine | Admitting: Family Medicine

## 2019-10-16 DIAGNOSIS — Z20822 Contact with and (suspected) exposure to covid-19: Secondary | ICD-10-CM | POA: Insufficient documentation

## 2019-10-16 DIAGNOSIS — R000 Tachycardia, unspecified: Secondary | ICD-10-CM | POA: Diagnosis not present

## 2019-10-16 DIAGNOSIS — R519 Headache, unspecified: Secondary | ICD-10-CM | POA: Insufficient documentation

## 2019-10-16 DIAGNOSIS — R6883 Chills (without fever): Secondary | ICD-10-CM

## 2019-10-16 DIAGNOSIS — R05 Cough: Secondary | ICD-10-CM | POA: Diagnosis not present

## 2019-10-16 DIAGNOSIS — R059 Cough, unspecified: Secondary | ICD-10-CM

## 2019-10-16 DIAGNOSIS — Z881 Allergy status to other antibiotic agents status: Secondary | ICD-10-CM | POA: Insufficient documentation

## 2019-10-16 DIAGNOSIS — R509 Fever, unspecified: Secondary | ICD-10-CM | POA: Diagnosis present

## 2019-10-16 DIAGNOSIS — B349 Viral infection, unspecified: Secondary | ICD-10-CM | POA: Insufficient documentation

## 2019-10-16 LAB — SARS CORONAVIRUS 2 (TAT 6-24 HRS): SARS Coronavirus 2: NEGATIVE

## 2019-10-16 MED ORDER — ACETAMINOPHEN 325 MG PO TABS
650.0000 mg | ORAL_TABLET | Freq: Once | ORAL | Status: AC
  Administered 2019-10-16: 650 mg via ORAL

## 2019-10-16 MED ORDER — ACETAMINOPHEN 160 MG/5ML PO SOLN
ORAL | Status: AC
Start: 2019-10-16 — End: ?
  Filled 2019-10-16: qty 20.3

## 2019-10-16 NOTE — ED Provider Notes (Signed)
[facility]   [id number] 10/16/19 Arrival Time: 0814   CC: COVID symptoms  SUBJECTIVE: History from: patient.  Donna Spencer is a 34 y.o. female who presents with abrupt onset of nasal congestion, PND, fever, tachycardia, chills, body aches and loss of appetite x 2 days ago. Denies sick exposure to COVID, flu or strep. Reports recent travel. Denies having hx Covid and has not been vaccinated for this. Has not taken OTC medications for this. There are no aggravating symptoms.  Denies previous symptoms in the past.   Denies sinus pain, rhinorrhea, sore throat, SOB, wheezing, chest pain, nausea, changes in bowel or bladder habits.    ROS: As per HPI.  All other pertinent ROS negative.     Past Medical History:  Diagnosis Date  . Allergy   . Anal fissure   . Anemia   . Anxiety   . Benign hematuria   . Mouth ulcers    recurrent, apthous   Past Surgical History:  Procedure Laterality Date  . REPAIR ANKLE LIGAMENT Right 2002   Allergies  Allergen Reactions  . Biaxin [Clarithromycin]     Reaction unknown to patient, reported as a baby  . Clarithromycin   . Codeine   . Dairy Aid [Lactase] Swelling    Avoid dairy all dairy products  . Flagyl [Metronidazole] Nausea And Vomiting and Other (See Comments)    Fever  . Oseltamivir Phosphate     Lesion on genitals   No current facility-administered medications on file prior to encounter.   Current Outpatient Medications on File Prior to Encounter  Medication Sig Dispense Refill  . Adapalene-Benzoyl Peroxide (EPIDUO) 0.1-2.5 % gel Apply topically at bedtime.     Social History   Socioeconomic History  . Marital status: Married    Spouse name: Iona Beard   . Number of children: Not on file  . Years of education: Designer, jewellery  . Highest education level: Not on file  Occupational History  . Occupation: Attorney   Tobacco Use  . Smoking status: Never Smoker  . Smokeless tobacco: Never Used  Substance and Sexual Activity   . Alcohol use: No    Comment: occasionally - couple times/month (liquor)  . Drug use: No  . Sexual activity: Yes    Birth control/protection: Condom    Comment: may want to consider pill again   Other Topics Concern  . Not on file  Social History Narrative  . Not on file   Social Determinants of Health   Financial Resource Strain:   . Difficulty of Paying Living Expenses:   Food Insecurity:   . Worried About Charity fundraiser in the Last Year:   . Arboriculturist in the Last Year:   Transportation Needs:   . Film/video editor (Medical):   Marland Kitchen Lack of Transportation (Non-Medical):   Physical Activity:   . Days of Exercise per Week:   . Minutes of Exercise per Session:   Stress:   . Feeling of Stress :   Social Connections:   . Frequency of Communication with Friends and Family:   . Frequency of Social Gatherings with Friends and Family:   . Attends Religious Services:   . Active Member of Clubs or Organizations:   . Attends Archivist Meetings:   Marland Kitchen Marital Status:   Intimate Partner Violence:   . Fear of Current or Ex-Partner:   . Emotionally Abused:   Marland Kitchen Physically Abused:   . Sexually Abused:    Family  History  Problem Relation Age of Onset  . Hypertension Mother   . Cancer Maternal Grandmother   . Breast cancer Maternal Grandmother   . GER disease Paternal Grandmother   . COPD Paternal Grandfather     OBJECTIVE:  Vitals:   10/16/19 1549  BP: 132/85  Pulse: (!) 110  Resp: 16  Temp: (!) 102.4 F (39.1 C)  TempSrc: Oral  SpO2: 97%     General appearance: alert; appears fatigued, but nontoxic; speaking in full sentences and tolerating own secretions HEENT: NCAT; Ears: EACs clear, TMs pearly gray; Eyes: PERRL.  EOM grossly intact. Sinuses: nontender; Nose: nares patent without rhinorrhea, Throat: oropharynx clear, tonsils non erythematous or enlarged, uvula midline  Neck: supple without LAD Lungs: unlabored respirations, symmetrical air  entry; cough: mild; no respiratory distress; CTAB Heart: regular rate and rhythm.  Radial pulses 2+ symmetrical bilaterally Skin: warm and dry Psychological: alert and cooperative; normal mood and affect  LABS:  No results found for this or any previous visit (from the past 24 hour(s)).   ASSESSMENT & PLAN:  1. Viral illness   2. Chills   3. Fever, unspecified fever cause   4. Tachycardia   5. Cough   6. Nonintractable headache, unspecified chronicity pattern, unspecified headache type     Meds ordered this encounter  Medications  . acetaminophen (TYLENOL) tablet 650 mg   Viral Illness Chills Fever Tachycardia Cough Headache   COVID testing ordered.  It will take between 1-2 days for test results.  Someone will contact you regarding abnormal results.    Patient should remain in quarantine until they have received Covid results.  If negative you may resume normal activities (go back to work/school) while practicing hand hygiene, social distance, and mask wearing.  If positive, patient should remain in quarantine for 10 days from symptom onset AND greater than 72 hours after symptoms resolution (absence of fever without the use of fever-reducing medication and improvement in respiratory symptoms), whichever is longer Get plenty of rest and push fluids Use OTC zyrtec for nasal congestion, runny nose, and/or sore throat Use OTC flonase for nasal congestion and runny nose Use medications daily for symptom relief Use OTC medications like ibuprofen or tylenol as needed fever or pain Call or go to the ED if you have any new or worsening symptoms such as fever, worsening cough, shortness of breath, chest tightness, chest pain, turning blue, changes in mental status.  Reviewed expectations re: course of current medical issues. Questions answered. Outlined signs and symptoms indicating need for more acute intervention. Patient verbalized understanding. After Visit Summary  given.         Faustino Congress, NP 10/21/19 2205

## 2019-10-16 NOTE — Discharge Instructions (Addendum)
Your COVID test is pending.  You should self quarantine until the test result is back.    Take Tylenol as needed for fever or discomfort.  Rest and keep yourself hydrated.    Go to the emergency department if you develop shortness of breath, severe diarrhea, high fever not relieved by Tylenol or ibuprofen, or other concerning symptoms.    

## 2019-10-16 NOTE — ED Triage Notes (Signed)
Pt presents with non productive cough, chills, generalized body aches, fever, and loss of appetite X 2 days.   Pt states she was traveling 2 days ago

## 2019-10-17 ENCOUNTER — Other Ambulatory Visit: Payer: Self-pay

## 2019-10-17 ENCOUNTER — Ambulatory Visit (INDEPENDENT_AMBULATORY_CARE_PROVIDER_SITE_OTHER): Payer: No Typology Code available for payment source

## 2019-10-17 ENCOUNTER — Ambulatory Visit (HOSPITAL_COMMUNITY)
Admission: EM | Admit: 2019-10-17 | Discharge: 2019-10-17 | Disposition: A | Discharge status: Home / Self Care | Payer: No Typology Code available for payment source | Attending: Physician Assistant | Admitting: Physician Assistant

## 2019-10-17 ENCOUNTER — Encounter (HOSPITAL_COMMUNITY): Payer: Self-pay

## 2019-10-17 DIAGNOSIS — Z3202 Encounter for pregnancy test, result negative: Secondary | ICD-10-CM | POA: Diagnosis not present

## 2019-10-17 DIAGNOSIS — R509 Fever, unspecified: Secondary | ICD-10-CM

## 2019-10-17 DIAGNOSIS — R0602 Shortness of breath: Secondary | ICD-10-CM | POA: Diagnosis present

## 2019-10-17 DIAGNOSIS — B349 Viral infection, unspecified: Secondary | ICD-10-CM

## 2019-10-17 DIAGNOSIS — R05 Cough: Secondary | ICD-10-CM

## 2019-10-17 LAB — POCT URINALYSIS DIP (DEVICE)
Bilirubin Urine: NEGATIVE
Glucose, UA: NEGATIVE mg/dL
Ketones, ur: NEGATIVE mg/dL
Leukocytes,Ua: NEGATIVE
Nitrite: NEGATIVE
Protein, ur: NEGATIVE mg/dL
Specific Gravity, Urine: >=1.03 (ref 1.005–1.030)
Urobilinogen, UA: 0.2 mg/dL (ref 0.0–1.0)
pH: 7 (ref 5.0–8.0)

## 2019-10-17 LAB — POC URINE PREG, ED: Preg Test, Ur: NEGATIVE

## 2019-10-17 NOTE — ED Provider Notes (Signed)
[place]    CSN: [id number] Arrival date & time: 10/17/19  1944      History   Chief Complaint No chief complaint on file.   HPI Donna Spencer is a 34 y.o. female.   Patient reports for follow-up from yesterday's visit for cough and fever.  She reports fevers improved however she is continue to have a dry cough.  She does report having some dull chest discomfort and some shortness of breath with exertion today.  She denies significant shortness of breath at rest however had some shortness of breath when walking down the stairs earlier today.  She is unsure whether anxiety makes this worse or not.  She does report some left-sided chest discomfort and points just above her heart.  She reports this is dull but does change some with deep breath.  She reports cough is worse with laying down.  She started taking the medicines as previously prescribed.  She reports fever maximum of 99 and is responded to Tylenol.  She does report a couple episodes of vomiting earlier after coughing however denies nausea currently.  She did have diarrhea early in the illness however that is subsided.  She reports symptoms started Saturday with a headache and low-grade temperature is developed in which she is currently experiencing.  She reports a family member who is a Marine scientist and mention that DVT could be a possibility given she had a recent 4-hour flight.  Patient returned from the [place] prior to symptom onset on Friday.  She reports a 4-hour flight where she did not move much.  She denies any calf tenderness or calf swelling at all during this episode.  Denies history of DVT or other blood clots.  Denies clotting disorder.  She reports she is not on oral contraceptives.  She does not smoke.  She is actively trying to get pregnant.  She had a Covid test yesterday's visit is returned negative and she is concerned about with this could be given a negative Covid test.  Denies sore throat or  significant runny nose.  She has been tolerating food and water with the exception of earlier today with one episode of vomiting.  She was able to consume dinner without any issue.  She does report increased urinary frequency and urgency but she does endorse she has been drinking a lot.  Denies painful urination or blood in urine.     Past Medical History:  Diagnosis Date  . Allergy   . Anal fissure   . Anemia   . Anxiety   . Benign hematuria   . Mouth ulcers    recurrent, apthous    Patient Active Problem List   Diagnosis Date Noted  . Dysmenorrhea 02/07/2016  . Obesity 01/26/2014  . Uses oral contraceptives 01/26/2014  . Anxiety   . Anal fissure   . Benign hematuria   . Mouth ulcers     Past Surgical History:  Procedure Laterality Date  . REPAIR ANKLE LIGAMENT Right 2002    OB History   No obstetric history on file.      Home Medications    Prior to Admission medications   Medication Sig Start Date End Date Taking? Authorizing Provider  Adapalene-Benzoyl Peroxide (EPIDUO) 0.1-2.5 % gel Apply topically at bedtime.    [provider]    Family History Family History  Problem Relation Age of Onset  . Hypertension Mother   . Cancer Maternal Grandmother   . Breast cancer Maternal Grandmother   .  GER disease Paternal Grandmother   . COPD Paternal Grandfather     Social History Social History   Tobacco Use  . Smoking status: Never Smoker  . Smokeless tobacco: Never Used  Substance Use Topics  . Alcohol use: No    Comment: occasionally - couple times/month (liquor)  . Drug use: No     Allergies   Biaxin [clarithromycin], Clarithromycin, Codeine, Dairy aid [lactase], Flagyl [metronidazole], and Oseltamivir phosphate   Review of Systems Review of Systems   Physical Exam Triage Vital Signs ED Triage Vitals  Enc Vitals Group     BP 10/17/19 1954 110/81     Pulse Rate 10/17/19 1954 (!) 107     Resp --      Temp 10/17/19 1954 99.7 F  (37.6 C)     Temp Source 10/17/19 1954 Oral     SpO2 10/17/19 1954 99 %     Weight --      Height --      Head Circumference --      Peak Flow --      Pain Score 10/17/19 1957 3     Pain Loc --      Pain Edu? --      Excl. in [place]? --    No data found.  Updated Vital Signs BP 110/81 (BP Location: Right Arm)   Pulse (!) 107   Temp 99.7 F (37.6 C) (Oral)   LMP 09/21/2019   SpO2 99%   Visual Acuity Right Eye Distance:   Left Eye Distance:   Bilateral Distance:    Right Eye Near:   Left Eye Near:    Bilateral Near:     Physical Exam Vitals and nursing note reviewed.  Constitutional:      General: She is not in acute distress.    Appearance: She is well-developed. She is not ill-appearing.  HENT:     Head: Normocephalic and atraumatic.     Mouth/Throat:     Mouth: Mucous membranes are moist.     Pharynx: Oropharynx is clear.     Comments: Mild postnasal drip Eyes:     Extraocular Movements: Extraocular movements intact.     Conjunctiva/sclera: Conjunctivae normal.     Pupils: Pupils are equal, round, and reactive to light.  Cardiovascular:     Rate and Rhythm: Regular rhythm. Tachycardia present.     Heart sounds: No murmur heard.   Pulmonary:     Effort: Pulmonary effort is normal. No respiratory distress.     Breath sounds: Normal breath sounds.  Chest:     Chest wall: Tenderness (There is some reproducible tenderness over the left chest, described not exactly as the same discomfort she has been feeling.) present.  Abdominal:     Palpations: Abdomen is soft.     Tenderness: There is no abdominal tenderness.  Musculoskeletal:        General: No tenderness.     Cervical back: Normal range of motion and neck supple.     Right lower leg: No edema.     Left lower leg: No edema.  Lymphadenopathy:     Cervical: No cervical adenopathy.  Skin:    General: Skin is warm and dry.     Findings: No rash.  Neurological:     General: No focal deficit present.      Mental Status: She is alert and oriented to person, place, and time.      UC Treatments / Results  Labs (all labs ordered  are listed, but only abnormal results are displayed) Labs Reviewed  POCT URINALYSIS DIP (DEVICE) - Abnormal; Notable for the following components:      Result Value   Hgb urine dipstick MODERATE (*)    All other components within normal limits  URINE CULTURE  POC URINE PREG, ED    EKG   Radiology DG Chest 2 View  Result Date: 10/17/2019 CLINICAL DATA:  34 year old female with fever and cough and shortness of breath. EXAM: CHEST - 2 VIEW COMPARISON:  Chest radiograph dated 08/22/2014. FINDINGS: The heart size and mediastinal contours are within normal limits. Both lungs are clear. The visualized skeletal structures are unremarkable. IMPRESSION: No active cardiopulmonary disease. Electronically Signed   By: Anner Crete M.D.   On: 10/17/2019 21:00    Procedures Procedures (including critical care time)  Medications Ordered in UC Medications - No data to display  Initial Impression / Assessment and Plan / UC Course  I have reviewed the triage vital signs and the nursing notes.  Pertinent labs & imaging results that were available during my care of the patient were reviewed by me and considered in my medical decision making (see chart for details).     #Viral illness #Shortness of breath Patient is a 34 year old female presenting with a viral syndrome picture.  UA with blood only.  Culture sent.  Chest x-ray without cardiopulmonary findings.  Covid was negative however given she has had respiratory and GI symptoms this is still in the differential.  Patient had concerns of clot and though some of her symptoms are consistent with possible pulmonary embolism with tachycardia, dyspnea and chest discomfort, only positive Wells criteria is elevated heart rate.  She has no calf swelling, no active cancer, no hemoptysis, no clinical signs of DVT and no history of  DVT, and viral illnesses just is likely.  I discussed with the patient that while there are some symptoms consistent with both viral respiratory illness and pulmonary embolism, she does follow a low risk for pulmonary embolism tonight.  Both patient and patient's husband were present for this discussion we discussed that I cannot fully rule out pulmonary embolism in the urgent care tonight but again state that she is low risk.  We came to shared decision making to have her discharged home with the understanding that if anything worsen that she should report to the emergency department for further evaluation.  Patient and patient's husband verbalized understanding that I cannot rule out pulmonary embolism here and agree with the plan. Final Clinical Impressions(s) / UC Diagnoses   Final diagnoses:  Viral illness  SOB (shortness of breath)     Discharge Instructions     If you feel more short of breath, have any worsening chest discomfort, high fever ,go to the Emergency department  Continue over the counter medications      ED Prescriptions    None     PDMP not reviewed this encounter.   Purnell Shoemaker, PA-C 10/17/19 2124

## 2019-10-17 NOTE — ED Triage Notes (Signed)
Pt presents today for cough, fever, body aches, chest pain, shortness of breath, and nausea/vomiting/diarrhea. Pt states she was seen yesterday for same, and received negative covid test but would like further work up. Pt states she recently traveled out of the country and is concerned for DVT r/t long flight. Pt denies any redness or swelling in lower extremities. Pt has been treating fever and pain with tylenol. Pt states fever is better but dull chest pain is still present. Last dose tylenol this morning.  Pt has also been treating cough with cough syrup and cough drops with out relief.

## 2019-10-17 NOTE — Discharge Instructions (Addendum)
If you feel more short of breath, have any worsening chest discomfort, high fever ,go to the Emergency department  Continue over the counter medications

## 2019-10-18 ENCOUNTER — Telehealth (HOSPITAL_COMMUNITY): Payer: Self-pay | Admitting: Physician Assistant

## 2019-10-18 NOTE — Telephone Encounter (Cosign Needed)
Patient feeling better today. Cough reduced. Chest pain has significantly improved. Fever controlled and has not returned. Some nausea but tolerating liquids well, decreased appetite. Declines zofran. Overall states feeling better. Discussed return precautions again and follow up with PCP in about a week to discuss possible serologic testing if Zika virus is of concern as she is actively trying to become pregnant.

## 2019-10-19 LAB — URINE CULTURE: Culture: <10000 — AB

## 2019-10-26 ENCOUNTER — Other Ambulatory Visit: Payer: Self-pay

## 2019-10-26 ENCOUNTER — Ambulatory Visit: Payer: No Typology Code available for payment source | Admitting: Family Medicine

## 2019-10-26 ENCOUNTER — Encounter: Payer: Self-pay | Admitting: Family Medicine

## 2019-10-26 VITALS — BP 114/84 | HR 100 | Temp 98.0°F | Ht 59.0 in | Wt 204.8 lb

## 2019-10-26 DIAGNOSIS — R05 Cough: Secondary | ICD-10-CM | POA: Diagnosis not present

## 2019-10-26 DIAGNOSIS — Z3A01 Less than 8 weeks gestation of pregnancy: Secondary | ICD-10-CM

## 2019-10-26 DIAGNOSIS — R058 Other specified cough: Secondary | ICD-10-CM

## 2019-10-26 NOTE — Progress Notes (Signed)
Patient ID: Donna Spencer, female    DOB: Apr 22, 1986  Age: 34 y.o. MRN: [id number]  Chief Complaint  Patient presents with  . Cough    Pt stated that she has been having a dry cough x2 weeks and she has already been tested x2 and both neg. She just found out she is pregant on 10/22/2019. Pt also stated that she traveled to [place] from 6/10-6/19/2021.    Subjective:  34 year old lady who has a history of having trouble to the [place] on vacation 6 10-6 19.  She subsequently has tested positive for pregnancy, with a last menstrual period 5/27.  She has been tested twice in the past week or so for COVID-19 which was negative.  She also received a test for Zika virus from her OB/GYN yesterday which is negative.  Her CBC had a slight elevation of the white count of 12,000, which is nonspecific in early pregnancy.  When she came back from her trip on the 19th she had a cyst slight fever, and that next day she had 1 or 2.4.  It gradually defervesced and she has not had a fever the last 4 days.  She does not smoke.  Is generally a healthy person.  Her husband is not ill.  Current allergies, medications, problem list, past/family and social histories reviewed.  Objective:  BP 114/84   Pulse 100   Temp 98 F (36.7 C) (Temporal)   Ht 4\' 11"  (1.499 m)   Wt 204 lb 12.8 oz (92.9 kg)   LMP 09/21/2019   SpO2 97%   BMI 41.36 kg/m   Pleasant lady, alert and oriented.  Throat clear.  Neck supple without nodes.  Chest is clear to auscultation.  Heart regular without murmur.  Abdomen soft without mass or tenderness. Assessment & Plan:   Assessment: 1. Post-viral cough syndrome   2. Less than [redacted] weeks gestation of pregnancy       Plan: Reassurance.  I do not think any treatment is needed at this time and she should resolve with time.  Return if further problems. No orders of the defined types were placed in this encounter.   No orders of the defined types were placed in this  encounter.        Patient Instructions    History and exam are most consistent with this having been a viral respiratory illness and you are now having a post viral cough.  The 2 viral illnesses that would be of concern would be COVID-19, which you have tested for negative twice.  You have also had the vaccination.  The other would be the Zika virus, which was tested at the other office and was negative from yesterday's test.  A post viral cough typically will run its course but it may take several weeks even.  Drink plenty of fluids.  Stay out of excessive hot dry air.  You can use pregnancy safe over-the-counter cough medications (check with pharmacist if necessary) but the primary thing is going to be just giving this time to resolve.  If you start running fever again or getting worse get rechecked.   If you have lab work done today you will be contacted with your lab results within the next 2 weeks.  If you have not heard from [place] then please contact us. The fastest way to get your results is to register for My Chart.   IF you received an x-ray today, you will receive an invoice from [facility]  Radiology. Please contact [facility] Radiology at [phone] with questions or concerns regarding your invoice.   IF you received labwork today, you will receive an invoice from [facility]. Please contact [facility] at [phone] with questions or concerns regarding your invoice.   Our billing staff will not be able to assist you with questions regarding bills from these companies.  You will be contacted with the lab results as soon as they are available. The fastest way to get your results is to activate your My Chart account. Instructions are located on the last page of this paperwork. If you have not heard from [place] regarding the results in 2 weeks, please contact this office.        Return if symptoms worsen or fail to improve.   Ruben Reason, MD 10/26/2019

## 2019-10-26 NOTE — Patient Instructions (Addendum)
  History and exam are most consistent with this having been a viral respiratory illness and you are now having a post viral cough.  The 2 viral illnesses that would be of concern would be COVID-19, which you have tested for negative twice.  You have also had the vaccination.  The other would be the Zika virus, which was tested at the other office and was negative from yesterday's test.  A post viral cough typically will run its course but it may take several weeks even.  Drink plenty of fluids.  Stay out of excessive hot dry air.  You can use pregnancy safe over-the-counter cough medications (check with pharmacist if necessary) but the primary thing is going to be just giving this time to resolve.  If you start running fever again or getting worse get rechecked.   If you have lab work done today you will be contacted with your lab results within the next 2 weeks.  If you have not heard from [place] then please contact us. The fastest way to get your results is to register for My Chart.   IF you received an x-ray today, you will receive an invoice from [facility] Radiology. Please contact [facility] Radiology at [phone] with questions or concerns regarding your invoice.   IF you received labwork today, you will receive an invoice from [place]. Please contact [facility] at [phone] with questions or concerns regarding your invoice.   Our billing staff will not be able to assist you with questions regarding bills from these companies.  You will be contacted with the lab results as soon as they are available. The fastest way to get your results is to activate your My Chart account. Instructions are located on the last page of this paperwork. If you have not heard from [place] regarding the results in 2 weeks, please contact this office.

## 2019-11-20 LAB — OB RESULTS CONSOLE HEPATITIS B SURFACE ANTIGEN: Hepatitis B Surface Ag: NEGATIVE

## 2019-11-20 LAB — OB RESULTS CONSOLE ABO/RH: RH Type: POSITIVE

## 2019-11-20 LAB — OB RESULTS CONSOLE ANTIBODY SCREEN: Antibody Screen: NEGATIVE

## 2019-11-20 LAB — OB RESULTS CONSOLE RPR: RPR: NONREACTIVE

## 2019-11-20 LAB — OB RESULTS CONSOLE GC/CHLAMYDIA
Chlamydia: NEGATIVE
Gonorrhea: NEGATIVE

## 2019-11-20 LAB — OB RESULTS CONSOLE HIV ANTIBODY (ROUTINE TESTING): HIV: NONREACTIVE

## 2019-11-20 LAB — OB RESULTS CONSOLE RUBELLA ANTIBODY, IGM: Rubella: IMMUNE

## 2019-12-05 ENCOUNTER — Telehealth: Payer: No Typology Code available for payment source | Admitting: Emergency Medicine

## 2019-12-06 ENCOUNTER — Ambulatory Visit
Payer: No Typology Code available for payment source | Attending: Obstetrics and Gynecology | Admitting: Genetic Counselor

## 2019-12-06 ENCOUNTER — Ambulatory Visit: Payer: No Typology Code available for payment source

## 2019-12-06 ENCOUNTER — Ambulatory Visit: Payer: Self-pay | Admitting: Genetic Counselor

## 2019-12-06 ENCOUNTER — Other Ambulatory Visit: Payer: Self-pay

## 2019-12-06 ENCOUNTER — Encounter: Payer: Self-pay | Admitting: Genetic Counselor

## 2019-12-06 DIAGNOSIS — Z315 Encounter for genetic counseling: Secondary | ICD-10-CM | POA: Diagnosis not present

## 2019-12-06 DIAGNOSIS — Z1379 Encounter for other screening for genetic and chromosomal anomalies: Secondary | ICD-10-CM | POA: Insufficient documentation

## 2019-12-06 DIAGNOSIS — Z3A10 10 weeks gestation of pregnancy: Secondary | ICD-10-CM | POA: Insufficient documentation

## 2019-12-06 DIAGNOSIS — Q992 Fragile X chromosome: Secondary | ICD-10-CM | POA: Insufficient documentation

## 2019-12-06 DIAGNOSIS — Z148 Genetic carrier of other disease: Secondary | ICD-10-CM

## 2019-12-06 NOTE — Progress Notes (Signed)
12/06/2019  Edwina Badalamenti [birth year] MRN: [id number] DOV: 12/06/2019  Ms. 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 Ms. Fontanella was accompanied to her appointment by her husband, Iona Beard.   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 Carel is a G72P0, 34 y.o. female. Her current pregnancy has completed [redacted]w[redacted]d (Estimated Date of Delivery: 07/02/20).  Ms. Cordner denied exposure to environmental toxins or chemical agents. She denied the use of alcohol, tobacco or street drugs. She reported taking prenatal vitamins and Doxylamine-pyridoxine. She denied significant viral illnesses, fevers, and bleeding during the course of her pregnancy. Ms. Simerly reported a personal history of ADHD. Her medical and surgical histories were otherwise noncontributory.   Family History  A three generation pedigree was drafted and reviewed. The family history is remarkable for the following:  - Ms. Sciandra's brother and maternal first cousin had speech delays, particularly in the area of articulation when they were children. Ms. Limb also has a paternal first cousin with a learning disability. We discussed that many times, developmental delays and learning disabilities are multifactorial in nature, occurring due to a combination of genetic and environmental factors that are difficult to identify. Developmental delays and learning disabilities can appear to run in families; thus, there is a chance that the couple's children could also experience developmental delays or learning disabilities of some kind. The couple understands that they should make the pediatrician aware of any concerns they have about their children's development.  - Ms. Veronica's husband Iona Beard and his brother were both born with hypospadias. Mr. Caprio also  had growth delays and took growth hormone in his childhood. We discussed that hypospadias can be isolated or can be a feature of a genetic condition. When isolated, the recurrence risk for hypospadias in a female sibling or female child is ~10%. Several patterns of inheritance have been described for conditions in which hypospadias is a feature. Without more information about the etiology of hypospadias in the family, precise risk assessment is limited.  - Mr. Vazguez's father was reportedly "born with his heart on the other side of his body". His medical and developmental history is otherwise noncontributory. Dextrocardia is a congenital heart defect (CHD) in which the heart is pointed toward the right side of the chest rather than the left. CHDs can be isolated or a feature of an underlying genetic condition. CHDs are most often multifactorial in etiology, but can also result from chromosome aberrations, single gene conditions, or teratogenic exposures. Isolated, nonsyndromic CHDs occur in ~0.5% of the general population. If Mr. Treanor father had an isolated CHD, the risk of recurrence for the couple's children is expected to be ~1%. If however, he had an underlying genetic condition that caused the CHD, the risk of recurrence could be increased. Without further information, an accurate risk assessment cannot be provided.   - Mr. Adger's mother has had breast cancer two times, once in her 72s then again in her 30s. Though most cancers are thought to be sporadic or due to environmental factors, some families appear to have a strong predisposition to cancers. When considering a family history of cancer, we look for common types of cancer in multiple family members occurring at younger than typical ages. We discussed the option of meeting with a cancer genetic counselor to discuss any possible screening or testing options available. If Mr. Drum is concerned about  the family history of cancer and  would like to learn more about the family's chance for an inherited cancer syndrome, he or his healthcare provider may refer him to the [facility] [id number]).   The remaining family histories were reviewed and found to be noncontributory for birth defects, intellectual disability, recurrent pregnancy loss, and known genetic conditions.    The patient's ethnicity is [place] and [place]. The father of the pregnancy's ethnicity is [place], [place], and [place]. Consanguinity was denied. Ms. Balgobin reported a history of Ashkenazi Jewish ancestry. Pedigree will be scanned under Media.  Discussion  Fragile X syndrome:  Ms. Pontarelli was referred for genetic counseling as she was identified as a carrier for fragile X syndrome on Horizon-27 carrier screening. Ms. Spindel was found to carry 93 and 31 CGG repeats in the FMR1 gene, making her a premutation carrier.   Fragile X syndrome is the most common cause of inherited intellectual disability and autism. Fragile X syndrome is caused by mutations in the FMR1 gene located on the X chromosome (Xq27.3). Nearly all cases of fragile X syndrome are caused by mutations in the CGG trinucleotide repeat region of the FMR1 gene. Typically, the FMR1 gene contains 6 to 44 CGG trinucleotide repeats. Individuals with this number of repeats are not affected by fragile X syndrome. Individuals with fragile X syndrome have over 200 CGG repeats. Most individuals with a full mutation (>200 repeats) have abnormal gene methylation that silences the FMR1 gene, preventing production of the FMRP protein. This protein plays a role in the development of synapses that are critical for relaying nerve impulses. Loss or deficiency of the FMRP protein leads to the symptoms associated with fragile X syndrome, including mild to moderate intellectual disability, developmental delays, autism, behavioral abnormalities, and characteristic physical features. Fragile X syndrome  usually affects males more severely than females, since males have one X chromosome and females have two.   Repeat sizes of 45-54 in the CGG trinucleotide segment of the FMR1 gene are considered to be intermediate sized repeats. Repeats of this size are also commonly referred to as the "gray zone". Individuals with repeat alleles that fall in the intermediate range are not at risk of having a child affected by fragile X syndrome, as an intermediate allele is not at risk of expanding to a full mutation in one generation. Individuals who have an allele that falls in the intermediate range do not experience any symptoms themselves.   Individuals with 55-200 CGG trinucleotide repeats in the FMR1 gene are premutation carriers for fragile X syndrome. Individuals with this number of repeats are at risk for their children to inherit a full mutation and potentially be affected by fragile X syndrome. The smallest known repeat size to expand into a full mutation is 56 repeats. Given that Ms. Guitron has one allele with 57 repeats, she has a chance of having a child affected by fragile X syndrome. For individuals with 55-59 repeats, the chance of expansion to a full mutation in each of their children is ~0.5-2% (Nolin et al., 2014).  We discussed that recent literature suggests that the risk of expansion to a full mutation is influenced by another set of trinucleotides called AGG. AGG repeats within the CGG repeat region in the FMR1 gene influence the risk of expansion to a full mutation from parent to child. CGG repeat regions with no AGG interruptions have the greatest risk for full mutation expansion. Thus, AGG repeat testing is critical to accurately assess an individual's  chance of having a child with a full fragile X syndrome mutation. Ms. Tyson AGG testing through [facility] is still pending. We discussed that if she is identified to carry one or more AGG interruptions in her premutation allele, the chance  of expansion into a full mutation for her children would be even further reduced.   We discussed that in addition to the chance of having a child affected by fragile X syndrome, premutation carriers have health risks of their own. Female premutation carriers are at risk for fragile X-associated primary ovarian insufficiency (FXPOI) and fragile X-associated tremor/ataxia syndrome (FXTAS). FXPOI is characterized by reduced function of the ovaries. This can cause irregular menstrual cycles, early menopause, and infertility. Female premutation carriers with repeat sizes between 55-79 have approximately a 10% chance of being affected by FXPOI. FXTAS is characterized by problems with movement and cognition after age 41 that worsen with age. Features of FXTAS include intention tremor (trembling of a limb when performing voluntary movements) and progressive cerebellar ataxia (problems with coordination and balance). Generally female premutation carriers are at higher risk for FXTAS than female premutation carriers. However, female premutation carriers have up to a 16.5% chance of developing FXTAS. Since Ms. Loge is a premutation carrier, she is at risk for FXPOI and FXTAS.    Fragile X syndrome is inherited in an X-linked pattern. Typically, females have one repeat size allele for each copy of their FMR1 gene. Given that females have two X chromosomes, it is expected that females have two copies of the FMR1 gene, and thus two repeat size alleles. Since males only have one X chromosome, they only have one copy of the FMR1 gene and thus one repeat size allele. If males inherit a full mutation, that one mutation is enough to cause fragile X syndrome. Females may also inherit a full mutation and be affected by fragile X syndrome; however, females are often less severely affected by the condition than males due to X inactivation. X inactivation is a natural process in which cells in a female's body inactivate or silence  one of their two X chromosomes. This compensates for the extra dose of X chromosome genes that females have compared to their female counterparts with only one X chromosome. Generally, when females have a full mutation, most cells in their bodies inactivate the X chromosome with the full mutation. However, skewed X inactivation can occur when cells in the body inactivate the normal X chromosome instead of the X chromosome with the mutation. These females may demonstrate symptoms associated with fragile X syndrome.  Female premutation carriers have a 50% chance of passing on their X chromosome with the unstable FMR1 allele to their children. With each pregnancy, a female premutation carrier has a 25% chance to have a female who inherits the normal sized allele, a 25% chance to have a female who inherits the normal sized allele, a 25% chance to have a female who inherits the premutation allele, and a 25% chance to have a female who inherits the premutation allele. Premutation alleles may expand into full mutations in female premutation carriers' offspring. Premutations in female carriers do not expand into full mutations; thus, males will always have daughters who are premutation carriers and sons who are unaffected.    Ms. Stoneberg was encouraged to discuss her premutation carrier status with her siblings. If Ms. Ploeger inherited her premutation from her father, then her sister is guaranteed to also be a premutation carrier. If Ms. Chirico inherited her premutation from  her mother, then both her sister and her brother would have a 50% chance of being premutation carriers themselves. It is recommended that these family members undergo FMR1 testing, as her sister may also be at risk of having a child affected with fragile X syndrome, and her siblings may be at risk for FXPOI/FXTAS.    Ms. Warsame carrier screening was negative for the other 26 conditions screened. Thus, her risk to be a carrier for these  additional conditions (listed separately in the laboratory report) has been reduced but not eliminated. This also significantly reduces her risk of having a child affected by one of these conditions. We discussed that since fragile X syndrome is X-linked, partner carrier screening for fragile X syndrome is not necessary.  Testing options:   Ms. Mullany was also counseled about the option of diagnostic testing for fetal fragile X syndrome. We reviewed that diagnostic testing via chorionic villus sampling (CVS) between 11-14 weeks' gestation or amniocentesis any time after 16 weeks' gestation would be available to determine whether a fetus has fragile X syndrome prenatally. We discussed the technical aspects of each procedure and quoted up to a 1 in 500 (0.2%) risk for spontaneous pregnancy loss or other adverse pregnancy outcomes as a result of either procedure. Cells from either a placental or amniotic fluid sample allow for direct genetic testing for repeat size length in the FMR1 gene. When a full mutation is identified, methylation analysis can determine whether the FMR1 gene is completely or partially methylated. This is important in predicting the expected severity of symptoms in an individual with a full mutation. We discussed that methylation analysis is not possible on CVS. This is because the methylation of the FMR1 gene is not established in placental tissue until ~12 weeks' gestation. Thus, if a full mutation is identified on CVS, an amniocentesis would be required to determine the methylation status of the expanded allele.   We also reviewed noninvasive prenatal screening (NIPS) as an available screening option. Specifically, we discussed that NIPS analyzes cell free fetal DNA found in the maternal blood circulation during pregnancy to primarily assess for fetal chromosomal aneuploidies. This test is not diagnostic for chromosome conditions, but can provide information regarding the presence or  absence of extra fetal DNA for chromosomes 13, 18 21, and the sex chromosomes as early as 10 weeks' gestation. Given that NIPS assesses for sex chromosome aneuploidies, predicted fetal sex can be assessed much earlier than it typically would be via anatomy ultrasound (usually performed between 18-20 weeks' gestation). If a pregnancy is predicted to be female, an individual may then consider undergoing diagnostic testing for fragile X syndrome more strongly. Ms. Jeanmarie indicated that she was interested in undergoing NIPS. She may consider undergoing diagnostic testing later in pregnancy depending on the fetal sex and results from her AGG analysis.  Options for future pregnancies:   We reviewed that there is an option to determine the fragile X syndrome status of a pregnancy prior to conception. Preimplantation genetic testing for monogenic conditions (PGT-M) such as fragile X syndrome is possible. PGT-M utilizes in vitro fertilization, with genetic testing for fragile X syndrome performed prior to embryo implantation. Embryos that are unaffected are then transferred to the uterus for implantation. We discussed that this technology is relatively new, and insurance oftentimes does not cover the cost of the procedure. However, I informed the couple that [facility] offers PGT-M for $99 for individuals who were identified as a carrier through one of their [employer] carrier  screening panels.  Plan:  Ms. Boehning had her blood drawn for [facility] NIPS today. Results will take approximately one week to be returned. I will call her once results become available. We can again discuss the option of CVS at that time. Additionally, it is possible that results from AGG analysis may be back for a more informative risk assessment, which we can also review at that time.  I provided Ms. Depaolo with a detailed booklet discussing women's health and fragile X premutations from the [employer]. I also informed  Ms. Benscoter about the Early Check research study. This research study adds fragile X syndrome onto the list of genetic conditions that are screened for as a part of newborn screening. The Early Check study is free to participate in if Ms. Pina is interested in enrolling. She will consider this after receiving results from her testing.  I counseled Ms. Diep regarding the above risks and available options. The approximate face-to-face time with the genetic counselor was 70 minutes.  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 I will check in to see if results are back next week. These results will modify the chance of expansion into a full mutation  Reviewed aneuploidy screening options  Opted to undergo [facility] NIPS. We will follow results  Offered additional testing and screening  May consider undergoing diagnostic testing depending on results from AGG analysis & fetal sex  Reviewed family history concerns   Buelah Manis, MS, Counselling psychologist

## 2019-12-06 NOTE — Progress Notes (Signed)
[facility] drawn in MFM.

## 2019-12-14 ENCOUNTER — Telehealth: Payer: Self-pay | Admitting: Genetic Counselor

## 2019-12-14 NOTE — Telephone Encounter (Signed)
I called Ms. Donna Spencer to give her an update on her noninvasive prenatal screening (NIPS) results and discuss results from her AGG testing. Ms. Donna Spencer had genetic counseling on 8/11 to discuss her premutation carrier status for fragile X syndrome (see Genetic Counseling note for more details). She underwent NIPS for chromosomal aneuploidies and to determine fetal sex, and AGG analysis in the FMR1 gene was pending at that time.  Unfortunately, Ms. Donna Spencer's NIPS results have been delayed. Per [facility], it is expected that results will be available around 8/23. However, I informed Ms. Donna Spencer that it is possible that her sample may fail and require a redraw. I will contact her next week with an update.  Ms. Donna Spencer analysis has been returned and demonstrated 1 AGG repeat in her premutation allele flanked by 9 and 47 CGG repeats. We discussed that based on this result, the chance of expansion into a full FMR1 mutation in her children is low, <1%. Per a study by Nolin et al. (2014), of 95 mother to child FMR1 transmissions from women with 55-59 CGG repeats and 1 AGG interruption, zero expanded into a full fragile X mutation. We reviewed that while Ms. Donna Spencer's result cannot guarantee that an expansion into a full mutation will never happen, it is very reassuring. Ms. Donna Spencer was relieved to hear this result and confirmed that she had no further questions at this time.  Donna Manis, MS, [facility] Genetic Counselor

## 2019-12-18 ENCOUNTER — Telehealth: Payer: Self-pay | Admitting: Genetic Counselor

## 2019-12-18 NOTE — Telephone Encounter (Signed)
I called Ms. Mcquarrie to discuss her negative noninvasive prenatal screening (NIPS)/cell free DNA (cfDNA) testing result. Specifically, Ms. Harriger had testing through the laboratory [facility]. These negative results demonstrated an expected representation of chromosome 21, 29, 30, and sex chromosome material, greatly reducing the likelihood of trisomies 87, 62, or 3 and sex chromosome aneuploidies for the pregnancy. Ms. Wallman requested to know about the expected fetal sex, which is female. Ms. Henne inquired how accurate the fetal sex prediction. Per [facility], the sensitivity for XY chromosomes on NIPS is 99.9%.  NIPS analyzes placental (fetal) DNA in maternal circulation. NIPS is considered to be highly specific and sensitive, but is not considered to be a diagnostic test. NIPS identifies 91-99% of pregnancies with trisomies 21, 13, and 18, as well as sex chromosome abnormalities, but does not test for all genetic conditions.   I asked Ms. Ergle if she was considering undergoing diagnostic testing given that this fetus is a female. She informed me that since her chance of expansion to a full fragile X syndrome mutation was low (<1% based on AGG analysis), she felt comfortable not pursuing it. However, she thinks that her husband wants to have diagnostic testing. She is going to discuss these NIPS results with him and make a joint decision from there. I offered to email her information about the Early Check research study to have fragile X syndrome be added to the baby's newborn screen in case this helps with decision-making, which she expressed interest in.   Ms. Shawley confirmed that she had no questions about these results at this time. I encouraged her to contact me if they have any further questions or if they need to talk through the option of diagnostic testing. She will update me once she has had time to speak with her husband.  Buelah Manis, MS, [facility] Genetic Counselor

## 2019-12-19 ENCOUNTER — Other Ambulatory Visit: Payer: Self-pay

## 2020-04-27 NOTE — L&D Delivery Note (Signed)
PROCEDURE DATE: 06/26/20   PREOPERATIVE DIAGNOSIS: Failed induction of labor   POSTOPERATIVE DIAGNOSIS: The same   PROCEDURE:    Primary Low Transverse Cesarean Section   SURGEON:  Dr. Alpha Gula   INDICATIONS: This is a 35 yo G1P0 at 71 wga requiring cesarean section secondary to failed induction of labor. She underwent elective induction at 39 weeks and did not make cervical change despite multiple doses of cytotec, titrated pitocin, and attempted foley placement that was unsuccessful due to persistent closed cervix.   Decision made to proceed with LTCS. The risks of cesarean section discussed with the patient included but were not limited to: bleeding which may require transfusion or reoperation; infection which may require antibiotics; injury to bowel, bladder, ureters or other surrounding organs; injury to the fetus; need for additional procedures including hysterectomy in the event of a life-threatening hemorrhage; placental abnormalities wth subsequent pregnancies, incisional problems, thromboembolic phenomenon and other postoperative/anesthesia complications. The patient agreed with the proposed plan, giving informed consent for the procedure.     FINDINGS:  Viable female infant in vertex presentation, APGARs 9, 9,  Weight pending, Amniotic fluid clear,  Intact placenta, three vessel cord.  Grossly normal uterus. .   ANESTHESIA:    Epidural QUANTITATIVE BLOOD LOSS: 496 cc SPECIMENS: Placenta for labor and delivery COMPLICATIONS: None immediate    PROCEDURE IN DETAIL:  The patient received intravenous antibiotics (2g Ancef) and had sequential compression devices applied to her lower extremities while in the preoperative area.  She was then taken to the operating room where epidural anesthesia was dosed up to surgical level and was found to be adequate. She was then placed in a dorsal supine position with a leftward tilt, and prepped and draped in a sterile manner.  A foley catheter was  placed into her bladder and attached to constant gravity.  After an adequate timeout was performed, a Pfannenstiel skin incision was made with scalpel and carried through to the underlying layer of fascia. The fascia was incised in the midline and this incision was extended bilaterally using the Mayo scissors. Kocher clamps were applied to the superior aspect of the fascial incision and the underlying rectus muscles were dissected off bluntly. A similar process was carried out on the inferior aspect of the facial incision. The rectus muscles were separated in the midline bluntly and the peritoneum was entered bluntly.  A bladder flap was created sharply and developed bluntly. Alexis retractor was utilized. A transverse hysterotomy was made with a scalpel and extended bilaterally bluntly. The infant was successfully delivered, loose nuchal cord x 2 was reduced and cord was clamped and cut and infant was handed over to awaiting neonatology team. Uterine massage was then administered and the placenta delivered intact with three-vessel cord. Cord gases were taken. The uterus was cleared of clot and debris.  The hysterotomy was closed with 0 vicryl.  A second imbricating suture of 0-vicryl was used to reinforce the incision and aid in hemostasis.The fascia was closed with 0-Vicryl in a running fashion with good restoration of anatomy.  The subcutaneus tissue was irrigated and was reapproximated using running plain gut stitches.  The skin was closed with 4-0 Vicryl in a subcuticular fashion.  All surgical site and was hemostatic at end of procedure without any further bleeding on exam.    Pt tolerated the procedure well. All sponge/lap/needle counts were correct  X 2. Pt taken to recovery room in stable condition.     Alpha Gula MD

## 2020-06-05 LAB — OB RESULTS CONSOLE GBS: GBS: POSITIVE

## 2020-06-20 ENCOUNTER — Telehealth (HOSPITAL_COMMUNITY): Payer: Self-pay | Admitting: *Deleted

## 2020-06-20 NOTE — Telephone Encounter (Signed)
Preadmission screen  

## 2020-06-21 ENCOUNTER — Encounter (HOSPITAL_COMMUNITY): Payer: Self-pay | Admitting: *Deleted

## 2020-06-21 ENCOUNTER — Telehealth (HOSPITAL_COMMUNITY): Payer: Self-pay | Admitting: *Deleted

## 2020-06-21 NOTE — Telephone Encounter (Signed)
Preadmission screen  

## 2020-06-25 NOTE — H&P (Addendum)
OB History and Physical   Donna Spencer is a 35 y.o. female G1P0 presenting for elective induction of labor at [redacted]w[redacted]d. 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   OB History    Gravida  1   Para      Term      Preterm      AB      Living        SAB      IAB      Ectopic      Multiple      Live Births             Past Medical History:  Diagnosis Date  . Allergy   . Anal fissure   . Anemia   . Anxiety   . Benign hematuria   . Mouth ulcers    recurrent, apthous   Past Surgical History:  Procedure Laterality Date  . REPAIR ANKLE LIGAMENT Right 2002   Family History: family history includes Breast cancer in her maternal grandmother; COPD in her paternal grandfather; Cancer in her maternal grandmother; GER disease in her paternal grandmother; Hypertension in her mother. Social History:  reports that she has never smoked. She has never used smokeless tobacco. She reports that she does not drink alcohol and does not use drugs.     Maternal Diabetes: No Genetic Screening: Abnormal:  Results: Other: fragileX premutation carrier Maternal Ultrasounds/Referrals: Normal Fetal Ultrasounds or other Referrals:  None Maternal Substance Abuse:  No Significant Maternal Medications:  None Significant Maternal Lab Results:  Group B Strep positive Other Comments:  None  Review of Systems History   Last menstrual period 09/21/2019. Exam Physical Exam   Addendum 07/08/20 for lack of physical exam General: alert, well appearing, no distress Chest: nonlabored breathing CV: 1+ edema bialterally Abdomen: gravid, soft, nontender Ext: no evidence of DVT  Prenatal labs: ABO, Rh: AB/Positive/-- (07/26 0000) Antibody: Negative (07/26 0000) Rubella: Immune (07/26 0000) RPR: Nonreactive (07/26 0000)  HBsAg: Negative (07/26 0000)  HIV: Non-reactive (07/26 0000)  GBS:     Assessment/Plan: . Admit to Labor and  Delivery . Cytotec for cervical ripening, followed by pitocin, AROM . Epidural when desired . PCN for GBS positive   Donna Spencer 06/25/2020, 9:18 PM

## 2020-06-26 ENCOUNTER — Other Ambulatory Visit (HOSPITAL_COMMUNITY): Payer: No Typology Code available for payment source | Attending: Obstetrics and Gynecology

## 2020-06-26 ENCOUNTER — Inpatient Hospital Stay (HOSPITAL_COMMUNITY): Payer: No Typology Code available for payment source | Admitting: Anesthesiology

## 2020-06-26 ENCOUNTER — Encounter (HOSPITAL_COMMUNITY): Payer: Self-pay | Admitting: Obstetrics and Gynecology

## 2020-06-26 ENCOUNTER — Inpatient Hospital Stay (HOSPITAL_COMMUNITY)
Admission: AD | Admit: 2020-06-26 | Discharge: 2020-06-28 | DRG: 788 | Disposition: A | Discharge status: Home / Self Care | Payer: No Typology Code available for payment source | Attending: Obstetrics and Gynecology | Admitting: Obstetrics and Gynecology

## 2020-06-26 ENCOUNTER — Encounter (HOSPITAL_COMMUNITY)
Admission: AD | Disposition: A | Discharge status: Home / Self Care | Payer: Self-pay | Source: Home / Self Care | Attending: Obstetrics and Gynecology

## 2020-06-26 ENCOUNTER — Other Ambulatory Visit: Payer: Self-pay

## 2020-06-26 ENCOUNTER — Inpatient Hospital Stay (HOSPITAL_COMMUNITY): Payer: No Typology Code available for payment source

## 2020-06-26 DIAGNOSIS — O99824 Streptococcus B carrier state complicating childbirth: Secondary | ICD-10-CM | POA: Diagnosis present

## 2020-06-26 DIAGNOSIS — Z20822 Contact with and (suspected) exposure to covid-19: Secondary | ICD-10-CM | POA: Diagnosis present

## 2020-06-26 DIAGNOSIS — Z3490 Encounter for supervision of normal pregnancy, unspecified, unspecified trimester: Secondary | ICD-10-CM

## 2020-06-26 DIAGNOSIS — O26893 Other specified pregnancy related conditions, third trimester: Secondary | ICD-10-CM | POA: Diagnosis present

## 2020-06-26 DIAGNOSIS — Z3A39 39 weeks gestation of pregnancy: Secondary | ICD-10-CM

## 2020-06-26 LAB — TYPE AND SCREEN
ABO/RH(D): AB POS
Antibody Screen: NEGATIVE

## 2020-06-26 LAB — CBC
HCT: 33.2 % — ABNORMAL LOW (ref 36.0–46.0)
Hemoglobin: 11.1 g/dL — ABNORMAL LOW (ref 12.0–15.0)
MCH: 30.9 pg (ref 26.0–34.0)
MCHC: 33.4 g/dL (ref 30.0–36.0)
MCV: 92.5 fL (ref 80.0–100.0)
Platelets: 324 10*3/uL (ref 150–400)
RBC: 3.59 MIL/uL — ABNORMAL LOW (ref 3.87–5.11)
RDW: 13.9 % (ref 11.5–15.5)
WBC: 13.2 10*3/uL — ABNORMAL HIGH (ref 4.0–10.5)
nRBC: 0 % (ref 0.0–0.2)

## 2020-06-26 LAB — RESP PANEL BY RT-PCR (FLU A&B, COVID) ARPGX2
Influenza A by PCR: NEGATIVE
Influenza B by PCR: NEGATIVE
SARS Coronavirus 2 by RT PCR: NEGATIVE

## 2020-06-26 LAB — RPR: RPR Ser Ql: NONREACTIVE

## 2020-06-26 SURGERY — Surgical Case
Anesthesia: Spinal

## 2020-06-26 MED ORDER — OXYCODONE HCL 5 MG/5ML PO SOLN: 5.0000 mg | Freq: Once | ORAL | Status: DC | PRN

## 2020-06-26 MED ORDER — NALBUPHINE HCL 10 MG/ML IJ SOLN: 5.0000 mg | INTRAMUSCULAR | Status: DC | PRN

## 2020-06-26 MED ORDER — FENTANYL CITRATE (PF) 100 MCG/2ML IJ SOLN: 25.0000 ug | INTRAMUSCULAR | Status: DC | PRN

## 2020-06-26 MED ORDER — MEPERIDINE HCL 25 MG/ML IJ SOLN: 6.2500 mg | INTRAMUSCULAR | Status: DC | PRN

## 2020-06-26 MED ORDER — OXYTOCIN BOLUS FROM INFUSION: 333.0000 mL | Freq: Once | INTRAVENOUS | Status: DC

## 2020-06-26 MED ORDER — BUTORPHANOL TARTRATE 1 MG/ML IJ SOLN
1.0000 mg | INTRAMUSCULAR | Status: DC | PRN
  Administered 2020-06-26: 1 mg via INTRAVENOUS
  Filled 2020-06-26: qty 1

## 2020-06-26 MED ORDER — NALOXONE HCL 4 MG/10ML IJ SOLN
1.0000 ug/kg/h | INTRAVENOUS | Status: DC | PRN
  Filled 2020-06-26: qty 5

## 2020-06-26 MED ORDER — PHENYLEPHRINE HCL-NACL 20-0.9 MG/250ML-% IV SOLN
INTRAVENOUS | Status: AC
  Filled 2020-06-26: qty 250

## 2020-06-26 MED ORDER — TERBUTALINE SULFATE 1 MG/ML IJ SOLN: 0.2500 mg | Freq: Once | INTRAMUSCULAR | Status: DC | PRN

## 2020-06-26 MED ORDER — KETOROLAC TROMETHAMINE 30 MG/ML IJ SOLN
INTRAMUSCULAR | Status: AC
  Filled 2020-06-26: qty 1

## 2020-06-26 MED ORDER — DOXYLAMINE SUCCINATE (SLEEP) 25 MG PO TABS
25.0000 mg | ORAL_TABLET | Freq: Every evening | ORAL | Status: DC | PRN
  Administered 2020-06-26: 25 mg via ORAL
  Filled 2020-06-26 (×2): qty 1

## 2020-06-26 MED ORDER — LACTATED RINGERS IV SOLN: INTRAVENOUS | Status: DC

## 2020-06-26 MED ORDER — LACTATED RINGERS IV SOLN
500.0000 mL | INTRAVENOUS | Status: DC | PRN
Start: 2020-06-26 — End: 2020-06-26
  Administered 2020-06-26: 500 mL via INTRAVENOUS
  Administered 2020-06-26: 1000 mL via INTRAVENOUS

## 2020-06-26 MED ORDER — SOD CITRATE-CITRIC ACID 500-334 MG/5ML PO SOLN
30.0000 mL | ORAL | Status: DC | PRN
  Administered 2020-06-26: 30 mL via ORAL
  Filled 2020-06-26: qty 15

## 2020-06-26 MED ORDER — OXYTOCIN-SODIUM CHLORIDE 30-0.9 UT/500ML-% IV SOLN: 2.5000 [IU]/h | INTRAVENOUS | Status: DC

## 2020-06-26 MED ORDER — PENICILLIN G POT IN DEXTROSE 60000 UNIT/ML IV SOLN
3.0000 10*6.[IU] | INTRAVENOUS | Status: DC
  Administered 2020-06-26 (×3): 3 10*6.[IU] via INTRAVENOUS
  Filled 2020-06-26 (×3): qty 50

## 2020-06-26 MED ORDER — MORPHINE SULFATE (PF) 0.5 MG/ML IJ SOLN
INTRAMUSCULAR | Status: DC | PRN
  Administered 2020-06-26: 150 ug via INTRATHECAL

## 2020-06-26 MED ORDER — PHENYLEPHRINE HCL-NACL 20-0.9 MG/250ML-% IV SOLN
INTRAVENOUS | Status: DC | PRN
  Administered 2020-06-26: 60 ug/min via INTRAVENOUS

## 2020-06-26 MED ORDER — PHENYLEPHRINE 40 MCG/ML (10ML) SYRINGE FOR IV PUSH (FOR BLOOD PRESSURE SUPPORT): 80.0000 ug | PREFILLED_SYRINGE | INTRAVENOUS | Status: DC | PRN

## 2020-06-26 MED ORDER — OXYCODONE-ACETAMINOPHEN 5-325 MG PO TABS: 2.0000 | ORAL_TABLET | ORAL | Status: DC | PRN

## 2020-06-26 MED ORDER — FENTANYL CITRATE (PF) 100 MCG/2ML IJ SOLN
INTRAMUSCULAR | Status: DC | PRN
  Administered 2020-06-26: 15 ug via INTRATHECAL

## 2020-06-26 MED ORDER — DIPHENHYDRAMINE HCL 50 MG/ML IJ SOLN: 12.5000 mg | INTRAMUSCULAR | Status: DC | PRN

## 2020-06-26 MED ORDER — MORPHINE SULFATE (PF) 0.5 MG/ML IJ SOLN
INTRAMUSCULAR | Status: AC
  Filled 2020-06-26: qty 10

## 2020-06-26 MED ORDER — HYDROXYZINE HCL 50 MG PO TABS
50.0000 mg | ORAL_TABLET | Freq: Four times a day (QID) | ORAL | Status: DC | PRN
Start: 2020-06-26 — End: 2020-06-26

## 2020-06-26 MED ORDER — SCOPOLAMINE 1 MG/3DAYS TD PT72
MEDICATED_PATCH | TRANSDERMAL | Status: AC
  Filled 2020-06-26: qty 1

## 2020-06-26 MED ORDER — EPHEDRINE 5 MG/ML INJ: 10.0000 mg | INTRAVENOUS | Status: DC | PRN

## 2020-06-26 MED ORDER — LACTATED RINGERS IV SOLN: INTRAVENOUS | Status: DC | PRN

## 2020-06-26 MED ORDER — OXYTOCIN-SODIUM CHLORIDE 30-0.9 UT/500ML-% IV SOLN
INTRAVENOUS | Status: DC | PRN
  Administered 2020-06-26: 30 [IU] via INTRAVENOUS

## 2020-06-26 MED ORDER — OXYCODONE-ACETAMINOPHEN 5-325 MG PO TABS: 1.0000 | ORAL_TABLET | ORAL | Status: DC | PRN

## 2020-06-26 MED ORDER — LACTATED RINGERS IV SOLN: 500.0000 mL | Freq: Once | INTRAVENOUS | Status: DC

## 2020-06-26 MED ORDER — OXYTOCIN-SODIUM CHLORIDE 30-0.9 UT/500ML-% IV SOLN
1.0000 m[IU]/min | INTRAVENOUS | Status: DC
  Administered 2020-06-26: 2 m[IU]/min via INTRAVENOUS
  Administered 2020-06-26: 6 m[IU]/min via INTRAVENOUS
  Filled 2020-06-26: qty 500

## 2020-06-26 MED ORDER — ACETAMINOPHEN 325 MG PO TABS
650.0000 mg | ORAL_TABLET | ORAL | Status: DC | PRN
  Administered 2020-06-26: 650 mg via ORAL
  Filled 2020-06-26: qty 2

## 2020-06-26 MED ORDER — NALOXONE HCL 0.4 MG/ML IJ SOLN: 0.4000 mg | INTRAMUSCULAR | Status: DC | PRN

## 2020-06-26 MED ORDER — SODIUM CHLORIDE 0.9% FLUSH: 3.0000 mL | INTRAVENOUS | Status: DC | PRN

## 2020-06-26 MED ORDER — SODIUM CHLORIDE 0.9 % IV SOLN
5.0000 10*6.[IU] | Freq: Once | INTRAVENOUS | Status: AC
  Administered 2020-06-26: 5 10*6.[IU] via INTRAVENOUS
  Filled 2020-06-26: qty 5

## 2020-06-26 MED ORDER — ONDANSETRON HCL 4 MG/2ML IJ SOLN
4.0000 mg | Freq: Three times a day (TID) | INTRAMUSCULAR | Status: DC | PRN
  Administered 2020-06-27: 4 mg via INTRAVENOUS
  Filled 2020-06-26: qty 2

## 2020-06-26 MED ORDER — ONDANSETRON HCL 4 MG/2ML IJ SOLN
INTRAMUSCULAR | Status: DC | PRN
Start: 2020-06-26 — End: 2020-06-26
  Administered 2020-06-26: 4 mg via INTRAVENOUS

## 2020-06-26 MED ORDER — CEFAZOLIN SODIUM-DEXTROSE 2-4 GM/100ML-% IV SOLN
INTRAVENOUS | Status: AC
  Filled 2020-06-26: qty 100

## 2020-06-26 MED ORDER — LIDOCAINE HCL (PF) 1 % IJ SOLN: 30.0000 mL | INTRAMUSCULAR | Status: DC | PRN

## 2020-06-26 MED ORDER — NALBUPHINE HCL 10 MG/ML IJ SOLN: 5.0000 mg | Freq: Once | INTRAMUSCULAR | Status: DC | PRN

## 2020-06-26 MED ORDER — CEFAZOLIN SODIUM-DEXTROSE 2-3 GM-%(50ML) IV SOLR
INTRAVENOUS | Status: DC | PRN
  Administered 2020-06-26: 2 g via INTRAVENOUS

## 2020-06-26 MED ORDER — NALBUPHINE HCL 10 MG/ML IJ SOLN
5.0000 mg | Freq: Once | INTRAMUSCULAR | Status: DC | PRN
Start: 2020-06-26 — End: 2020-06-28

## 2020-06-26 MED ORDER — FENTANYL CITRATE (PF) 100 MCG/2ML IJ SOLN
INTRAMUSCULAR | Status: AC
  Filled 2020-06-26: qty 2

## 2020-06-26 MED ORDER — ONDANSETRON HCL 4 MG/2ML IJ SOLN
INTRAMUSCULAR | Status: AC
  Filled 2020-06-26: qty 2

## 2020-06-26 MED ORDER — KETOROLAC TROMETHAMINE 30 MG/ML IJ SOLN: 30.0000 mg | Freq: Four times a day (QID) | INTRAMUSCULAR | Status: AC | PRN

## 2020-06-26 MED ORDER — ONDANSETRON HCL 4 MG/2ML IJ SOLN: 4.0000 mg | Freq: Once | INTRAMUSCULAR | Status: DC | PRN

## 2020-06-26 MED ORDER — FENTANYL-BUPIVACAINE-NACL 0.5-0.125-0.9 MG/250ML-% EP SOLN: 12.0000 mL/h | EPIDURAL | Status: DC | PRN

## 2020-06-26 MED ORDER — DIPHENHYDRAMINE HCL 25 MG PO CAPS
25.0000 mg | ORAL_CAPSULE | ORAL | Status: DC | PRN
  Administered 2020-06-27 – 2020-06-28 (×2): 25 mg via ORAL
  Filled 2020-06-26 (×2): qty 1

## 2020-06-26 MED ORDER — KETOROLAC TROMETHAMINE 30 MG/ML IJ SOLN
30.0000 mg | Freq: Four times a day (QID) | INTRAMUSCULAR | Status: AC | PRN
  Administered 2020-06-26 – 2020-06-27 (×2): 30 mg via INTRAVENOUS
  Filled 2020-06-26: qty 1

## 2020-06-26 MED ORDER — OXYCODONE HCL 5 MG PO TABS: 5.0000 mg | ORAL_TABLET | Freq: Once | ORAL | Status: DC | PRN

## 2020-06-26 MED ORDER — MISOPROSTOL 25 MCG QUARTER TABLET
25.0000 ug | ORAL_TABLET | ORAL | Status: DC | PRN
  Administered 2020-06-26 (×3): 25 ug via VAGINAL
  Filled 2020-06-26 (×3): qty 1

## 2020-06-26 MED ORDER — BUPIVACAINE IN DEXTROSE 0.75-8.25 % IT SOLN
INTRATHECAL | Status: DC | PRN
  Administered 2020-06-26: 1.4 mL via INTRATHECAL

## 2020-06-26 MED ORDER — SCOPOLAMINE 1 MG/3DAYS TD PT72
1.0000 | MEDICATED_PATCH | Freq: Once | TRANSDERMAL | Status: DC
  Administered 2020-06-26: 1.5 mg via TRANSDERMAL

## 2020-06-26 MED ORDER — ONDANSETRON HCL 4 MG/2ML IJ SOLN: 4.0000 mg | Freq: Four times a day (QID) | INTRAMUSCULAR | Status: DC | PRN

## 2020-06-26 SURGICAL SUPPLY — 33 items
BENZOIN TINCTURE PRP APPL 2/3 (GAUZE/BANDAGES/DRESSINGS) IMPLANT
CHLORAPREP W/TINT 26ML (MISCELLANEOUS) ×2 IMPLANT
CLAMP CORD UMBIL (MISCELLANEOUS) IMPLANT
CLOSURE STERI STRIP 1/2 X4 (GAUZE/BANDAGES/DRESSINGS) ×2 IMPLANT
CLOTH BEACON ORANGE TIMEOUT ST (SAFETY) ×2 IMPLANT
DRSG OPSITE POSTOP 4X10 (GAUZE/BANDAGES/DRESSINGS) ×2 IMPLANT
ELECT REM PT RETURN 9FT ADLT (ELECTROSURGICAL) ×2
ELECTRODE REM PT RTRN 9FT ADLT (ELECTROSURGICAL) ×1 IMPLANT
EXTRACTOR VACUUM M CUP 4 TUBE (SUCTIONS) IMPLANT
GLOVE BIOGEL PI IND STRL 7.0 (GLOVE) ×2 IMPLANT
GLOVE BIOGEL PI IND STRL 7.5 (GLOVE) ×1 IMPLANT
GLOVE BIOGEL PI INDICATOR 7.0 (GLOVE) ×2
GLOVE BIOGEL PI INDICATOR 7.5 (GLOVE) ×1
GLOVE ECLIPSE 7.0 STRL STRAW (GLOVE) ×2 IMPLANT
GOWN STRL REUS W/TWL LRG LVL3 (GOWN DISPOSABLE) ×4 IMPLANT
HEMOSTAT ARISTA ABSORB 3G PWDR (HEMOSTASIS) ×2 IMPLANT
KIT ABG SYR 3ML LUER SLIP (SYRINGE) IMPLANT
NEEDLE HYPO 25X5/8 SAFETYGLIDE (NEEDLE) IMPLANT
NS IRRIG 1000ML POUR BTL (IV SOLUTION) ×2 IMPLANT
PACK C SECTION WH (CUSTOM PROCEDURE TRAY) ×2 IMPLANT
PAD OB MATERNITY 4.3X12.25 (PERSONAL CARE ITEMS) ×2 IMPLANT
PENCIL SMOKE EVAC W/HOLSTER (ELECTROSURGICAL) ×2 IMPLANT
STRIP CLOSURE SKIN 1/2X4 (GAUZE/BANDAGES/DRESSINGS) IMPLANT
SUT PDS AB 0 CTX 60 (SUTURE) IMPLANT
SUT PLAIN 0 NONE (SUTURE) IMPLANT
SUT PLAIN 2 0 XLH (SUTURE) ×2 IMPLANT
SUT VIC AB 0 CT1 36 (SUTURE) ×2 IMPLANT
SUT VIC AB 0 CTX 36 (SUTURE) ×4
SUT VIC AB 0 CTX36XBRD ANBCTRL (SUTURE) ×2 IMPLANT
SUT VIC AB 4-0 KS 27 (SUTURE) ×4 IMPLANT
TOWEL OR 17X24 6PK STRL BLUE (TOWEL DISPOSABLE) ×2 IMPLANT
TRAY FOLEY W/BAG SLVR 14FR LF (SET/KITS/TRAYS/PACK) ×2 IMPLANT
WATER STERILE IRR 1000ML POUR (IV SOLUTION) ×4 IMPLANT

## 2020-06-26 NOTE — Progress Notes (Signed)
Cervix 1 externally but closed internally.  Foley attempted to be placed with use of stylette but unsuccessful.  Cervix is otherwise soft.  Ctx present on toco but patient still comfortable. Now has been 12+ hrs on cytotec, will transition to pitocin and hope for better response.  Plan discussed with patient and she is agreeable.  Cat 1 tracing presently.   Alpha Gula MD

## 2020-06-26 NOTE — Anesthesia Procedure Notes (Signed)
Spinal  Patient location during procedure: OR Staffing Performed: anesthesiologist  Anesthesiologist: Tra Wilemon E, MD Preanesthetic Checklist Completed: patient identified, IV checked, risks and benefits discussed, surgical consent, monitors and equipment checked, pre-op evaluation and timeout performed Spinal Block Patient position: sitting Prep: DuraPrep and site prepped and draped Patient monitoring: continuous pulse ox, blood pressure and heart rate Approach: midline Location: L3-4 Injection technique: single-shot Needle Needle type: Pencan  Needle gauge: 24 G Needle length: 9 cm Additional Notes Functioning IV was confirmed and monitors were applied. Sterile prep and drape, including hand hygiene and sterile gloves were used. The patient was positioned and the spine was prepped. The skin was anesthetized with lidocaine.  Free flow of clear CSF was obtained prior to injecting local anesthetic into the CSF. The needle was carefully withdrawn. The patient tolerated the procedure well.      

## 2020-06-26 NOTE — Progress Notes (Signed)
Labor Progress Note  Cervix checked at 09:15, closed internally, FT externally.  Seems to have thinned some to 50%, soft.  Not ready for cervical foley. Will try third dose of cytotec. FHT cat 1.  Also, vertex was confirmed on bedside ultrasound.  Will reassess after 4 hrs of third cytotec dose.  Continue current management.   Alpha Gula MD

## 2020-06-26 NOTE — Op Note (Signed)
PROCEDURE DATE: 06/26/20   PREOPERATIVE DIAGNOSIS: Failed induction of labor   POSTOPERATIVE DIAGNOSIS: The same   PROCEDURE:    Primary Low Transverse Cesarean Section   SURGEON:  Dr. Alpha Gula   INDICATIONS: This is a 35 yo G1P0 at 34 wga requiring cesarean section secondary to failed induction of labor. She underwent elective induction at 39 weeks and did not make cervical change despite multiple doses of cytotec, titrated pitocin, and attempted foley placement that was unsuccessful due to persistent closed cervix.   Decision made to proceed with LTCS. The risks of cesarean section discussed with the patient included but were not limited to: bleeding which may require transfusion or reoperation; infection which may require antibiotics; injury to bowel, bladder, ureters or other surrounding organs; injury to the fetus; need for additional procedures including hysterectomy in the event of a life-threatening hemorrhage; placental abnormalities wth subsequent pregnancies, incisional problems, thromboembolic phenomenon and other postoperative/anesthesia complications. The patient agreed with the proposed plan, giving informed consent for the procedure.     FINDINGS:  Viable female infant in vertex presentation, APGARs 9, 9,  Weight pending, Amniotic fluid clear,  Intact placenta, three vessel cord.  Grossly normal uterus. .   ANESTHESIA:    Epidural QUANTITATIVE BLOOD LOSS: 496 cc SPECIMENS: Placenta for labor and delivery COMPLICATIONS: None immediate    PROCEDURE IN DETAIL:  The patient received intravenous antibiotics (2g Ancef) and had sequential compression devices applied to her lower extremities while in the preoperative area.  She was then taken to the operating room where epidural anesthesia was dosed up to surgical level and was found to be adequate. She was then placed in a dorsal supine position with a leftward tilt, and prepped and draped in a sterile manner.  A foley catheter was  placed into her bladder and attached to constant gravity.  After an adequate timeout was performed, a Pfannenstiel skin incision was made with scalpel and carried through to the underlying layer of fascia. The fascia was incised in the midline and this incision was extended bilaterally using the Mayo scissors. Kocher clamps were applied to the superior aspect of the fascial incision and the underlying rectus muscles were dissected off bluntly. A similar process was carried out on the inferior aspect of the facial incision. The rectus muscles were separated in the midline bluntly and the peritoneum was entered bluntly.  A bladder flap was created sharply and developed bluntly. Alexis retractor was utilized. A transverse hysterotomy was made with a scalpel and extended bilaterally bluntly. The infant was successfully delivered, loose nuchal cord x 2 was reduced and cord was clamped and cut and infant was handed over to awaiting neonatology team. Uterine massage was then administered and the placenta delivered intact with three-vessel cord. Cord gases were taken. The uterus was cleared of clot and debris.  The hysterotomy was closed with 0 vicryl.  A second imbricating suture of 0-vicryl was used to reinforce the incision and aid in hemostasis.The fascia was closed with 0-Vicryl in a running fashion with good restoration of anatomy.  The subcutaneus tissue was irrigated and was reapproximated using running plain gut stitches.  The skin was closed with 4-0 Vicryl in a subcuticular fashion.  All surgical site and was hemostatic at end of procedure without any further bleeding on exam.    Pt tolerated the procedure well. All sponge/lap/needle counts were correct  X 2. Pt taken to recovery room in stable condition.     Alpha Gula MD

## 2020-06-26 NOTE — Progress Notes (Signed)
Labor Progress Note  At bedside to discuss plan.   Labor induction course thus far has included several doses of cytotec, failed placement of foley due to closed internal os, titration of pitocin to 14 mU/min with persistently closed internal os.  Station remains -2-3.  Patient admits to feeling discouraged, concerned for lack of progress and eventual C section.  I discussed FHT has been cat 1 with occasional decels, but overall quite reassuring and able to continue with current management.  We discussed alternative of C section, and given persistent closed cervix despite interventions, I believe induction has not been successful.  She requests C section and we discussed the risks, benefits, which include but are not limited to bleeding, infection, damage to nearby organs, need for blood transfusion which she is agreeable to when medically indicated.  Will proceed with non-urgent C section.  Stop pitocin and alert OR to proceed when able. All questions answered  Alpha Gula MD.

## 2020-06-26 NOTE — Anesthesia Postprocedure Evaluation (Signed)
Anesthesia Post Note  Patient: Quarry manager  Procedure(s) Performed: CESAREAN SECTION (N/A )     Patient location during evaluation: PACU Anesthesia Type: Spinal Level of consciousness: oriented and awake and alert Pain management: pain level controlled Vital Signs Assessment: post-procedure vital signs reviewed and stable Respiratory status: spontaneous breathing, respiratory function stable and nonlabored ventilation Cardiovascular status: blood pressure returned to baseline and stable Postop Assessment: no headache, no backache, no apparent nausea or vomiting and spinal receding Anesthetic complications: no   No complications documented.  Last Vitals:  Vitals:   06/26/20 2315 06/26/20 2322  BP: 116/76   Pulse: 79 87  Resp: 20 (!) 28  Temp:    SpO2: 100% 98%    Last Pain:  Vitals:   06/26/20 2318  TempSrc:   PainSc: 3    Pain Goal:    LLE Motor Response: Purposeful movement (06/26/20 2318) LLE Sensation: Tingling (06/26/20 2318) RLE Motor Response: Purposeful movement (06/26/20 2318) RLE Sensation: Tingling (06/26/20 2318)     Epidural/Spinal Function Cutaneous sensation: Tingles (06/26/20 2245), Patient able to flex knees: Yes (06/26/20 2245), Patient able to lift hips off bed: No (06/26/20 2245), Back pain beyond tenderness at insertion site: No (06/26/20 2245), Progressively worsening motor and/or sensory loss: No (06/26/20 2245), Bowel and/or bladder incontinence post epidural: No (06/26/20 2245)  Lidia Collum

## 2020-06-26 NOTE — Anesthesia Preprocedure Evaluation (Addendum)
Anesthesia Evaluation  Patient identified by MRN, date of birth, ID band Patient awake    Reviewed: Allergy & Precautions, H&P , NPO status , Patient's Chart, lab work & pertinent test results  History of Anesthesia Complications Negative for: history of anesthetic complications  Airway Mallampati: II  TM Distance: >3 FB Neck ROM: full    Dental no notable dental hx.    Pulmonary neg pulmonary ROS,    Pulmonary exam normal        Cardiovascular negative cardio ROS Normal cardiovascular exam     Neuro/Psych negative neurological ROS  negative psych ROS   GI/Hepatic negative GI ROS, Neg liver ROS,   Endo/Other  Morbid obesity  Renal/GU negative Renal ROS  negative genitourinary   Musculoskeletal   Abdominal   Peds  Hematology negative hematology ROS (+)   Anesthesia Other Findings   Reproductive/Obstetrics (+) Pregnancy                             Anesthesia Physical Anesthesia Plan  ASA: III and emergent  Anesthesia Plan: Spinal   Post-op Pain Management:    Induction:   PONV Risk Score and Plan: Ondansetron and Treatment may vary due to age or medical condition  Airway Management Planned:   Additional Equipment:   Intra-op Plan:   Post-operative Plan:   Informed Consent: I have reviewed the patients History and Physical, chart, labs and discussed the procedure including the risks, benefits and alternatives for the proposed anesthesia with the patient or authorized representative who has indicated his/her understanding and acceptance.       Plan Discussed with:   Anesthesia Plan Comments: ( C/S for failed induction)       Anesthesia Quick Evaluation

## 2020-06-26 NOTE — Progress Notes (Signed)
Labor Progress Note  Patient doing well, s/p 2 doses of cytotec with cervix closed on last check.  Discussed plan for IOL and alternative option of Foley balloon for mechanical ripening.  Patient would like to consider Foley. Will reassess at next check it it would be able to be placed.  Otherwise, FHT cat I.  Complaints of burning pain in IV with PCN. S/p 2 doses. Will try slower infusion rate.    All questions answered, continue IOL Alpha Gula MD

## 2020-06-26 NOTE — Transfer of Care (Signed)
Immediate Anesthesia Transfer of Care Note  Patient: Donna Spencer  Procedure(s) Performed: CESAREAN SECTION (N/A )  Patient Location: PACU  Anesthesia Type:Spinal  Level of Consciousness: awake  Airway & Oxygen Therapy: Patient Spontanous Breathing  Post-op Assessment: Report given to RN and Post -op Vital signs reviewed and stable  Post vital signs: Reviewed and stable  Last Vitals:  Vitals Value Taken Time  BP 114/67 06/26/20 2241  Temp    Pulse 75 06/26/20 2243  Resp 10 06/26/20 2243  SpO2 98 % 06/26/20 2243  Vitals shown include unvalidated device data.  Last Pain:  Vitals:   06/26/20 1842  TempSrc:   PainSc: 2          Complications: No complications documented.

## 2020-06-27 ENCOUNTER — Encounter (HOSPITAL_COMMUNITY): Payer: Self-pay | Admitting: Obstetrics and Gynecology

## 2020-06-27 LAB — CBC
HCT: 28.5 % — ABNORMAL LOW (ref 36.0–46.0)
Hemoglobin: 9.4 g/dL — ABNORMAL LOW (ref 12.0–15.0)
MCH: 31.1 pg (ref 26.0–34.0)
MCHC: 33 g/dL (ref 30.0–36.0)
MCV: 94.4 fL (ref 80.0–100.0)
Platelets: 258 10*3/uL (ref 150–400)
RBC: 3.02 MIL/uL — ABNORMAL LOW (ref 3.87–5.11)
RDW: 14 % (ref 11.5–15.5)
WBC: 16.1 10*3/uL — ABNORMAL HIGH (ref 4.0–10.5)
nRBC: 0 % (ref 0.0–0.2)

## 2020-06-27 MED ORDER — FAMOTIDINE 20 MG PO TABS
20.0000 mg | ORAL_TABLET | Freq: Two times a day (BID) | ORAL | Status: DC
Start: 2020-06-27 — End: 2020-06-28
  Administered 2020-06-27 – 2020-06-28 (×3): 20 mg via ORAL
  Filled 2020-06-27 (×3): qty 1

## 2020-06-27 MED ORDER — SENNOSIDES-DOCUSATE SODIUM 8.6-50 MG PO TABS
2.0000 | ORAL_TABLET | Freq: Every day | ORAL | Status: DC
  Administered 2020-06-27 – 2020-06-28 (×2): 2 via ORAL
  Filled 2020-06-27 (×2): qty 2

## 2020-06-27 MED ORDER — LACTATED RINGERS IV SOLN: INTRAVENOUS | Status: DC

## 2020-06-27 MED ORDER — MENTHOL 3 MG MT LOZG: 1.0000 | LOZENGE | OROMUCOSAL | Status: DC | PRN

## 2020-06-27 MED ORDER — TETANUS-DIPHTH-ACELL PERTUSSIS 5-2.5-18.5 LF-MCG/0.5 IM SUSY: 0.5000 mL | PREFILLED_SYRINGE | Freq: Once | INTRAMUSCULAR | Status: DC

## 2020-06-27 MED ORDER — DIBUCAINE (PERIANAL) 1 % EX OINT: 1.0000 "application " | TOPICAL_OINTMENT | CUTANEOUS | Status: DC | PRN

## 2020-06-27 MED ORDER — COCONUT OIL OIL: 1.0000 "application " | TOPICAL_OIL | Status: DC | PRN

## 2020-06-27 MED ORDER — OXYCODONE HCL 5 MG PO TABS
5.0000 mg | ORAL_TABLET | ORAL | Status: DC | PRN
  Administered 2020-06-28: 5 mg via ORAL
  Filled 2020-06-27: qty 1

## 2020-06-27 MED ORDER — SIMETHICONE 80 MG PO CHEW: 80.0000 mg | CHEWABLE_TABLET | ORAL | Status: DC | PRN

## 2020-06-27 MED ORDER — LACTATED RINGERS IV BOLUS
500.0000 mL | Freq: Once | INTRAVENOUS | Status: AC
  Administered 2020-06-27: 500 mL via INTRAVENOUS

## 2020-06-27 MED ORDER — WITCH HAZEL-GLYCERIN EX PADS: 1.0000 "application " | MEDICATED_PAD | CUTANEOUS | Status: DC | PRN

## 2020-06-27 MED ORDER — DOXYLAMINE-PYRIDOXINE 10-10 MG PO TBEC: 2.0000 | DELAYED_RELEASE_TABLET | Freq: Every day | ORAL | Status: DC

## 2020-06-27 MED ORDER — CALCIUM CARBONATE ANTACID 500 MG PO CHEW: 2.0000 | CHEWABLE_TABLET | Freq: Four times a day (QID) | ORAL | Status: DC | PRN

## 2020-06-27 MED ORDER — SIMETHICONE 80 MG PO CHEW
80.0000 mg | CHEWABLE_TABLET | Freq: Three times a day (TID) | ORAL | Status: DC
  Administered 2020-06-27 – 2020-06-28 (×4): 80 mg via ORAL
  Filled 2020-06-27 (×4): qty 1

## 2020-06-27 MED ORDER — ACETAMINOPHEN 325 MG PO TABS: 650.0000 mg | ORAL_TABLET | ORAL | Status: DC | PRN

## 2020-06-27 MED ORDER — OXYTOCIN-SODIUM CHLORIDE 30-0.9 UT/500ML-% IV SOLN: 2.5000 [IU]/h | INTRAVENOUS | Status: AC

## 2020-06-27 MED ORDER — DIPHENHYDRAMINE HCL 25 MG PO CAPS: 25.0000 mg | ORAL_CAPSULE | Freq: Four times a day (QID) | ORAL | Status: DC | PRN

## 2020-06-27 MED ORDER — HYDROCORTISONE ACETATE 25 MG RE SUPP: 25.0000 mg | Freq: Two times a day (BID) | RECTAL | Status: DC | PRN

## 2020-06-27 MED ORDER — IBUPROFEN 800 MG PO TABS
800.0000 mg | ORAL_TABLET | Freq: Three times a day (TID) | ORAL | Status: DC
  Administered 2020-06-27 – 2020-06-28 (×3): 800 mg via ORAL
  Filled 2020-06-27 (×3): qty 1

## 2020-06-27 MED ORDER — FERROUS SULFATE 325 (65 FE) MG PO TABS
325.0000 mg | ORAL_TABLET | Freq: Every day | ORAL | Status: DC
  Administered 2020-06-27: 325 mg via ORAL
  Filled 2020-06-27: qty 1

## 2020-06-27 MED ORDER — ZOLPIDEM TARTRATE 5 MG PO TABS: 5.0000 mg | ORAL_TABLET | Freq: Every evening | ORAL | Status: DC | PRN

## 2020-06-27 NOTE — Progress Notes (Signed)
POD # 1  Doing well Routine care  BP 103/64 (BP Location: Right Wrist)   Pulse 64   Temp 98 F (36.7 C)   Resp 18   Ht 4\' 11"  (1.499 m)   Wt 96.1 kg   LMP 09/21/2019   SpO2 99%   Breastfeeding Unknown   BMI 42.80 kg/m  Results for orders placed or performed during the hospital encounter of 06/26/20 (from the past 24 hour(s))  CBC     Status: Abnormal   Collection Time: 06/27/20  5:13 AM  Result Value Ref Range   WBC 16.1 (H) 4.0 - 10.5 K/uL   RBC 3.02 (L) 3.87 - 5.11 MIL/uL   Hemoglobin 9.4 (L) 12.0 - 15.0 g/dL   HCT 28.5 (L) 36.0 - 46.0 %   MCV 94.4 80.0 - 100.0 fL   MCH 31.1 26.0 - 34.0 pg   MCHC 33.0 30.0 - 36.0 g/dL   RDW 14.0 11.5 - 15.5 %   Platelets 258 150 - 400 K/uL   nRBC 0.0 0.0 - 0.2 %   Abdomen dressing is dry b  POD # 1  Doing well Routine care  Planning on a bris

## 2020-06-27 NOTE — Lactation Note (Signed)
This note was copied from a baby's chart. Lactation Consultation Note  Patient Name: [name] IDPOE'U Date: 06/27/2020 Reason for consult: Initial assessment;1st time breastfeeding;Term Age:34 hours Per dad infant was off and on breast in recovery breastfeed for 10 minutes. LC entered room, mom was doing STS with infant, after multiple attempts to latch infant at the breast, [place] asked mom to pre-pump breast with hand pump and applied 20 mm NS prior to latching infant at the breast. Mom latched infant on her right breast using the football hold position, infant started sustaining latch and breastfeed off and on for 20 minutes. Mom started having N&V LC informed RN. RN will set mom up with DEBP later tonight when mom is feeling better, mom understands to pump every 3 hours for 15 minutes on initial setting. Mom will continue to BF infant according to cues, 8 to 12 or more times within 24 hours, STS and use 20 mm NS when latching infant at the breast. Mom will wear breast shells in bra during the day and not while sleep or at night. Mom knows to call RN or LC if she needs further assistance with latching infant at the breast. LC discussed infant's  input and output with parents. Mom made aware of O/P services, breastfeeding support groups, community resources, and our phone # for post-discharge questions.   Maternal Data Has patient been taught Hand Expression?: Yes Does the patient have breastfeeding experience prior to this delivery?: No  Feeding Mother's Current Feeding Choice: Breast Milk  LATCH Score Latch: Repeated attempts needed to sustain latch, nipple held in mouth throughout feeding, stimulation needed to elicit sucking reflex.  Audible Swallowing: A few with stimulation  Type of Nipple: Flat  Comfort (Breast/Nipple): Soft / non-tender  Hold (Positioning): Assistance needed to correctly position infant at breast and maintain latch.  LATCH Score: 6   Lactation  Tools Discussed/Used Tools: Shells;Pump;Nipple [place] [employer] Pump Education: Setup, frequency, and cleaning;Milk Storage (RN will set up DEBP) Reason for Pumping: Mom using 20 mm NS Pumping frequency: Mom understand to pump evry 3 hours for 15 minutes on inital setting  Interventions Interventions: Breast feeding basics reviewed;Assisted with latch;Skin to skin;Hand express;Breast compression;Adjust position;Support pillows;Position options;Expressed milk;Shells;Hand pump;DEBP;Education  Discharge Pump: DEBP;Manual;Personal WIC Program: No  Consult Status Consult Status: Follow-up Date: 06/27/20 Follow-up type: In-patient    Vicente Serene 06/27/2020, 1:15 AM

## 2020-06-27 NOTE — Lactation Note (Signed)
This note was copied from a baby's chart. Lactation Consultation Note  Patient Name: Donna Spencer Ju WGYKZ'L Date: 06/27/2020 Reason for consult: Follow-up assessment;Term;[place]ap[place];1st time breastfeeding Age:34 hours   P1 mother whose infant is now 29 hours old.  This is a term baby at 39+1 weeks.  Baby was asleep when I arrived.  Parents interested in attempting to latch with my assistance.    Mother's breasts are large, soft and non tender and nipples are very short shafted.  Mother has a small bruise on the left areola from a poor latch.  She does not complain of pain.  She has been given a NS but would prefer not to use it.  Completed comprehensive education related to breast feeding basics and NS use. Reviewed hand expression.  Offered to attempt without it.  Baby was able to latch, however, did not want to initiate sucking.  Demonstrated breast compressions and gentle stimulation.  He became agitated with stimulation.  Mother was able to express a couple of drops which I finger fed back to baby.  Asked mother to demonstrate NS placement; reviewed how to properly place on breast.  Baby latched, however, he was still not very interested in sucking.  With constant stimulation he sucked on/off for 10 minutes.  Suggested mother begin pumping consistently after every feeding.  Mother has only pumped once.  Observed her pumping with the #30 flanges which were too big at this time.  Taught mother how to observe for correct flange size and to increase as needed.  Mother verbalized understanding.  Suggested mother call her RN/LC for assistance as needed.  Mother has a DEBP for home use.  Father present and supportive.  RN in room at end of my visit.   Maternal Data Has patient been taught Hand Expression?: Yes Does the patient have breastfeeding experience prior to this delivery?: No  Feeding Mother's Current Feeding Choice: Breast Milk  LATCH Score Latch: Repeated attempts needed to  sustain latch, nipple held in mouth throughout feeding, stimulation needed to elicit sucking reflex.  Audible Swallowing: None  Type of Nipple: Everted at rest and after stimulation (Very short shafted)  Comfort (Breast/Nipple): Soft / non-tender  Hold (Positioning): Assistance needed to correctly position infant at breast and maintain latch.  LATCH Score: 6   Lactation Tools Discussed/Used Tools: Shells;Pump;Flanges;Nipple Shields Nipple shield size: 24 Flange Size: 27 Breast pump type: Double-Electric Breast Pump;Manual (Reviewed) Pump Education: Setup, frequency, and cleaning;Milk Storage (Reviewed) Reason for Pumping: mothers choice  Interventions Interventions: Breast feeding basics reviewed;Assisted with latch;Skin to skin;Breast massage;Hand express;Breast compression;Adjust position;DEBP;Hand pump;Shells;Expressed milk;Position options;Support pillows;Education  Discharge Pump: Personal (Home use)  Consult Status Consult Status: Follow-up Date: 06/28/20 Follow-up type: In-patient    Tannar Broker R Ebonie Westerlund 06/27/2020, 4:06 PM

## 2020-06-28 ENCOUNTER — Inpatient Hospital Stay (HOSPITAL_COMMUNITY): Payer: No Typology Code available for payment source

## 2020-06-28 ENCOUNTER — Inpatient Hospital Stay (HOSPITAL_COMMUNITY)
Admission: AD | Admit: 2020-06-28 | Payer: No Typology Code available for payment source | Source: Home / Self Care | Admitting: Obstetrics and Gynecology

## 2020-06-28 MED ORDER — IBUPROFEN 800 MG PO TABS: 800.0000 mg | ORAL_TABLET | Freq: Three times a day (TID) | ORAL | 0 refills | Status: DC | PRN

## 2020-06-28 MED ORDER — OXYCODONE HCL 5 MG PO TABS: 5.0000 mg | ORAL_TABLET | ORAL | 0 refills | Status: DC | PRN

## 2020-06-28 NOTE — Lactation Note (Signed)
This note was copied from a baby's chart. Lactation Consultation Note  Patient Name: Donna Spencer ESLPN'P Date: 06/28/2020 Reason for consult: Follow-up assessment;Mother's request;Primapara;1st time breastfeeding;Term;Infant weight loss;Nipple pain/trauma Age:34 hours Mom trying to latch infant on arrival. Last feeding 2 hrs prior for 25 minutes. Infant in an outfit ready for discharge sleeping with arms at his side. He is not showing any cues so LC unable to assess the latch.   Mom's nipples are small and short shafted. Mom stated not able to latch infant without use of the 20 NS. LC reviewed how to get a deep latch even with use of the NS and check for signs of milk transfer. Mom to compress the breasts, bring infant to the breasts and check lips are flanged with latching. Infant has recessed chin and can tuck in lips. LC demonstrated how to flange lips outward during feeding.   Mom using coconut oil for nipple care. Mom has a pump at home is aware NS is a barrier to let down. She will need to pump using her electric pump q 3hrs for 15 minutes.   [facility] brochure provided reviewing all services both inpatient and outpatient available.  All questions answered at the end of the visit.  Maternal Data    Feeding Mother's Current Feeding Choice: Breast Milk  LATCH Score                    Lactation Tools Discussed/Used Tools: Pump Nipple shield size: 20 Flange Size: 27  Interventions Interventions: Breast feeding basics reviewed;Breast compression;Hand pump;Skin to skin;Support pillows;Breast massage;Hand express;Expressed milk;Education;Pre-pump if needed  Discharge Discharge Education: Engorgement and breast care;Warning signs for feeding baby;Outpatient recommendation Pump: Personal  Consult Status Consult Status: Complete Date: 06/28/20    [name]  [name] 06/28/2020, 1:07 PM

## 2020-06-28 NOTE — Discharge Summary (Signed)
Postpartum Discharge Summary       Patient Name: Donna Spencer DOB: November 24, 1985 MRN: [id number]  Date of admission: 06/26/2020 Delivery date:06/26/2020  Delivering provider: Eyvonne Mechanic A  Date of discharge: 06/28/2020  Admitting diagnosis: Pregnancy [Z34.90] Intrauterine pregnancy: [redacted]w[redacted]d    Secondary diagnosis:  Active Problems:   Pregnancy  Additional problems:      Discharge diagnosis: Term Pregnancy Delivered                                              Post partum procedures:  Augmentation: Cytotec Complications: None  Hospital course: Induction of Labor With Cesarean Section   35y.o. yo G1P1001 at 363w1das admitted to the hospital 06/26/2020 for induction of labor. Patient had a labor course significant for poor response to induction attempts. The patient went for cesarean section due to failed induction. Delivery details are as follows: Membrane Rupture Time/Date: 9:44 PM ,06/26/2020   Delivery Method:C-Section, Low Transverse  Details of operation can be found in separate operative Note.  Patient had an uncomplicated postpartum course. She is ambulating, tolerating a regular diet, passing flatus, and urinating well.  Patient is discharged home in stable condition on 06/28/20.      Newborn Data: Birth date:06/26/2020  Birth time:9:45 PM  Gender:Female  Living status:Living  Apgars:9 ,9  Weight:3056 g                                 Magnesium Sulfate received: No BMZ received: No Rhophylac:N/A MMR:N/A T-DaP:Given prenatally Flu: Yes Transfusion:No  Physical exam  Vitals:   06/27/20 0543 06/27/20 0754 06/27/20 2117 06/27/20 2120  BP:  103/64 (!) 92/55 111/65  Pulse:  64 72   Resp: 18  16   Temp: 98.6 F (37 C) 98 F (36.7 C) 97.9 F (36.6 C)   TempSrc: Oral  Oral   SpO2: 99%  100%   Weight:      Height:       General: alert, cooperative and no distress Lochia: appropriate Uterine Fundus: firm Incision: Healing well with no significant  drainage DVT Evaluation: No evidence of DVT seen on physical exam. Labs: Lab Results  Component Value Date   WBC 16.1 (H) 06/27/2020   HGB 9.4 (L) 06/27/2020   HCT 28.5 (L) 06/27/2020   MCV 94.4 06/27/2020   PLT 258 06/27/2020   CMP Latest Ref Rng & Units 07/18/2019  Glucose 65 - 99 mg/dL 80  BUN 6 - 20 mg/dL 10  Creatinine 0.57 - 1.00 mg/dL 0.80  Sodium 134 - 144 mmol/L 137  Potassium 3.5 - 5.2 mmol/L 4.6  Chloride 96 - 106 mmol/L 101  CO2 20 - 29 mmol/L 24  Calcium 8.7 - 10.2 mg/dL 9.5  Total Protein 6.0 - 8.5 g/dL 7.5  Total Bilirubin 0.0 - 1.2 mg/dL <0.2  Alkaline Phos 39 - 117 IU/L 75  AST 0 - 40 IU/L 15  ALT 0 - 32 IU/L 16   Edinburgh Score: No flowsheet data found.    After visit meds:  Allergies as of 06/28/2020      Reactions   Biaxin [clarithromycin] Other (See Comments)   Reaction unknown to patient, reported as a baby   Codeine Other (See Comments)   Unknown childhood reaction   Dairy Aid [lactase]  Swelling   Avoid dairy all dairy products   Flagyl [metronidazole] Nausea And Vomiting, Other (See Comments)   Fever   Oseltamivir Phosphate Other (See Comments)   Lesion on genitals      Medication List    TAKE these medications   acetaminophen 500 MG tablet Commonly known as: TYLENOL Take 500 mg by mouth every 6 (six) hours as needed for mild pain or headache.   calcium carbonate 500 MG chewable tablet Commonly known as: TUMS - dosed in mg elemental calcium Chew 2 tablets by mouth 4 (four) times daily as needed for indigestion or heartburn.   clotrimazole 1 % cream Commonly known as: LOTRIMIN Apply 1 application topically 2 (two) times daily as needed (For rash).   Doxylamine-Pyridoxine 10-10 MG Tbec Take 2 tablets by mouth at bedtime.   famotidine 20 MG tablet Commonly known as: PEPCID Take 20 mg by mouth 2 (two) times daily.   ferrous sulfate 325 (65 FE) MG tablet Take 325 mg by mouth at bedtime.   hydrocortisone 25 MG  suppository Commonly known as: ANUSOL-HC Place 25 mg rectally 2 (two) times daily as needed for hemorrhoids.   hydrocortisone cream 1 % Apply 1 application topically 2 (two) times daily as needed for itching.   ibuprofen 800 MG tablet Commonly known as: ADVIL Take 1 tablet (800 mg total) by mouth every 8 (eight) hours as needed.   oxyCODONE 5 MG immediate release tablet Commonly known as: Oxy IR/ROXICODONE Take 1-2 tablets (5-10 mg total) by mouth every 4 (four) hours as needed for moderate pain.   prenatal multivitamin Tabs tablet Take 1 tablet by mouth at bedtime.   simethicone 80 MG chewable tablet Commonly known as: MYLICON Chew 80 mg by mouth every 6 (six) hours as needed for flatulence.        Discharge home in stable condition Infant Feeding: Breast Infant Disposition:home with mother Discharge instruction: per After Visit Summary and Postpartum booklet. Activity: Advance as tolerated. Pelvic rest for 6 weeks.  Diet: routine diet Anticipated Birth Control: Unsure Postpartum Appointment:6 weeks Additional Postpartum F/U:   Future Appointments:No future appointments. Follow up Visit:      06/28/2020 Luz Lex, MD

## 2020-06-28 NOTE — Social Work (Signed)
MOB was referred for history of anxiety.   * Referral screened out by Clinical Social Worker because none of the following criteria appear to apply:  ~ History of anxiety/depression during this pregnancy, or of post-partum depression following prior delivery. ~ Diagnosis of anxiety and/or depression within last 3 years. OR * MOB's symptoms currently being treated with medication and/or therapy. MOB was started on Zoloft at [place].  MOB scored a 7 on the [place] Depression Scale.  Please contact the Clinical Social Worker if needs arise or by MOB request.  Darra Lis, [place] [employer]  [phone]

## 2020-07-02 IMAGING — MR MRI ABDOMEN WITH AND WITHOUT CONTRAST
10 of 19 series · 19 of 48 positions shown · IV contrast (gadavist)
Comparison: Report from 10/21/2018 outside CT abdomen/pelvis
(images not available).

CLINICAL DATA: Abnormal CT abdomen study demonstrating multiple
indeterminate liver masses.

EXAM:
MRI ABDOMEN WITHOUT AND WITH CONTRAST
TECHNIQUE: Multiplanar multisequence MR imaging of the abdomen was performed
both before and after the administration of intravenous contrast.
CONTRAST:  9 cc Gadavist IV.

[Series 3: T2 fat-sat · axial · 5.0mm · 0.90mm/px · 1 of 56 slices shown]
[im 1/56]
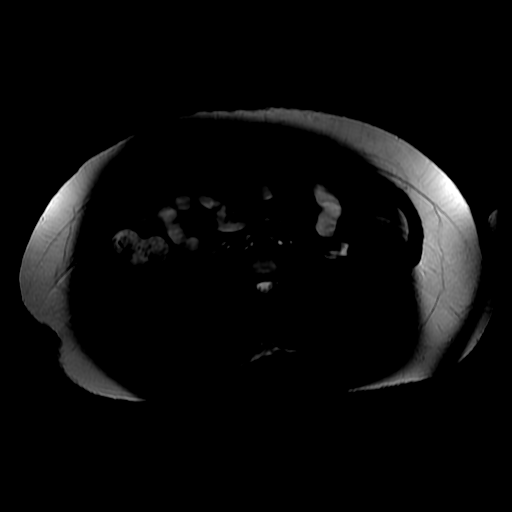

[Series 4: DWI b500 · axial · 6.0mm · 1.80mm/px · 1 of 72 slices shown]
[im 1/72]
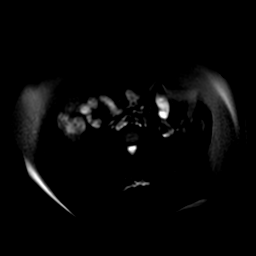

[Series 5: T2 · axial · 5.0mm · 0.90mm/px · z∈[-107,+168]mm · 2 of 56 slices shown (1 of 2)]
[im 1/56]
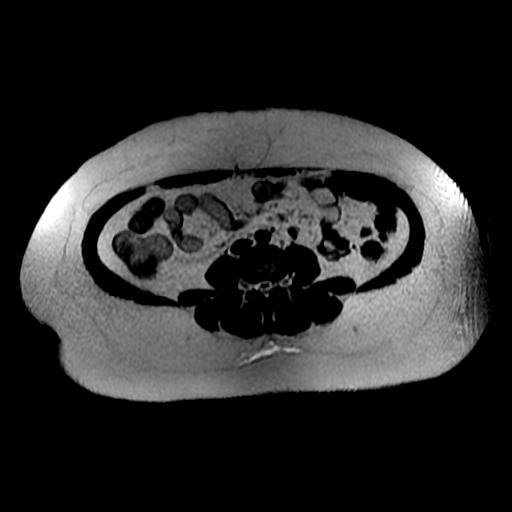
[im 56/56]
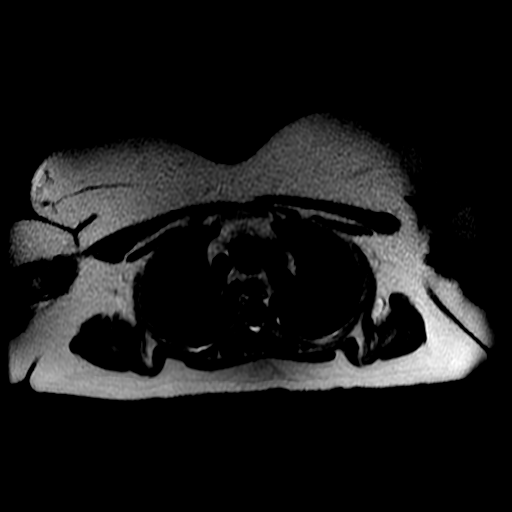

[Series 6: T2 · coronal · 5.0mm · 0.82mm/px · 1 of 45 slices shown (2 of 2)]
[im 1/45]
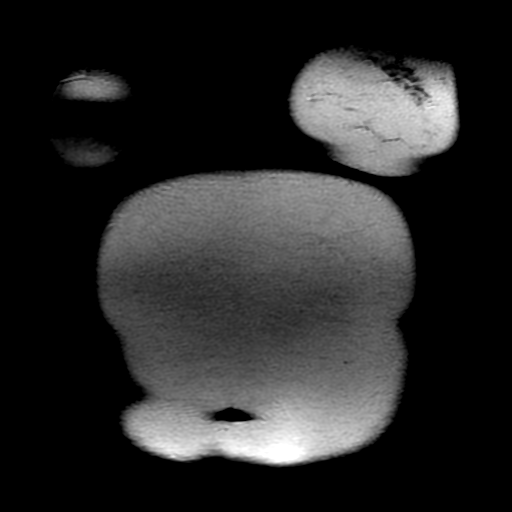

[Series 7: bSSFP · axial · 5.0mm · 0.90mm/px · z∈[-107,+168]mm · 2 of 56 slices shown]
[im 1/56]
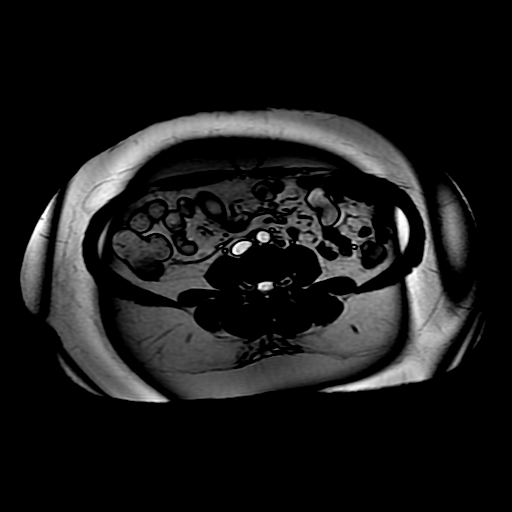
[im 56/56]
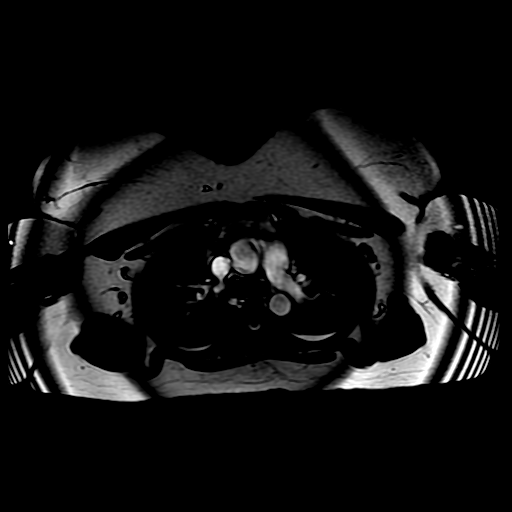

[Series 8: ax dualecho bh · axial · 5.0mm · 0.90mm/px · z∈[-107,+168]mm · 4 of 112 slices shown]
[im 1/112]
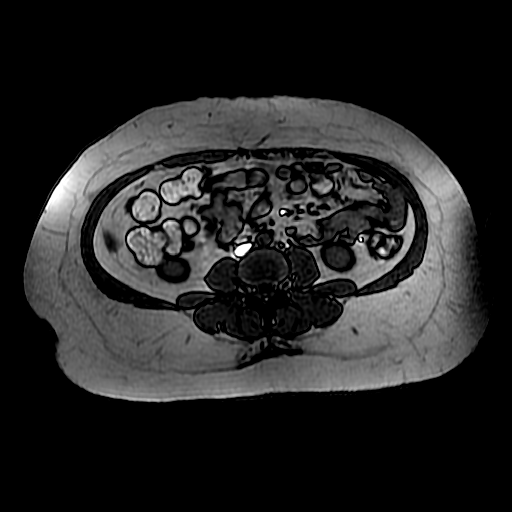
[im 38/112]
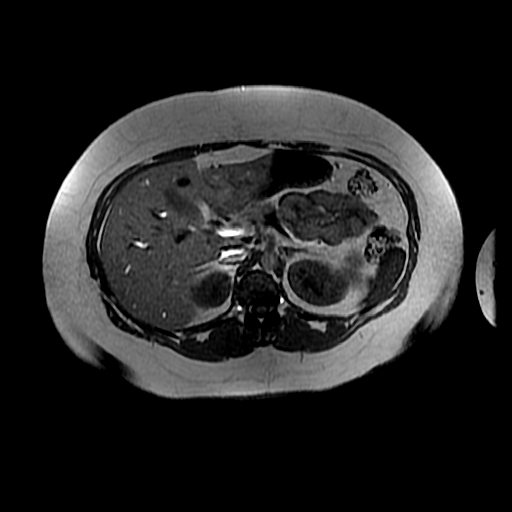
[im 75/112]
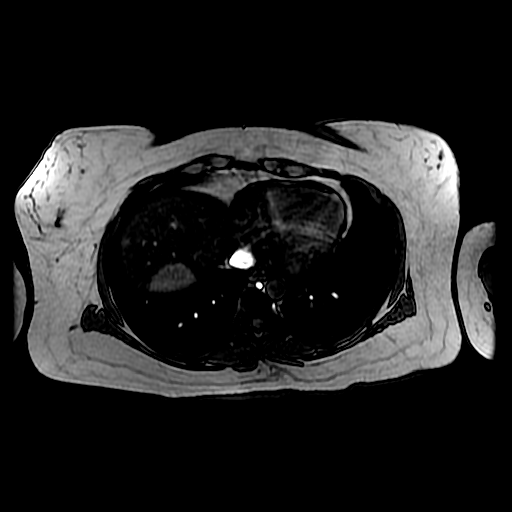
[im 112/112]
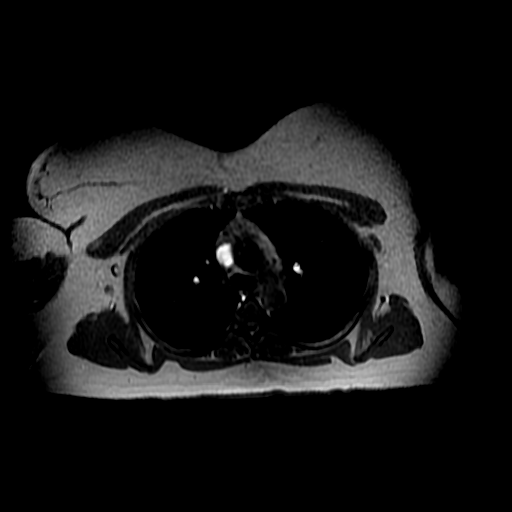

[Series 400: DWI · axial · 6.0mm · 1.80mm/px · 1 of 36 slices shown]
[im 1/36]
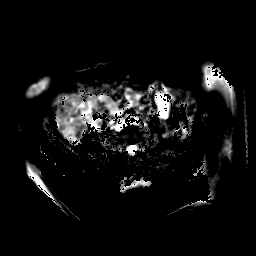

[Series 900: T1 dynamic · axial · 5.8mm · 0.86mm/px · z∈[-95,+157]mm · 3 of 88 slices shown (1 of 3)]
[im 1/88]
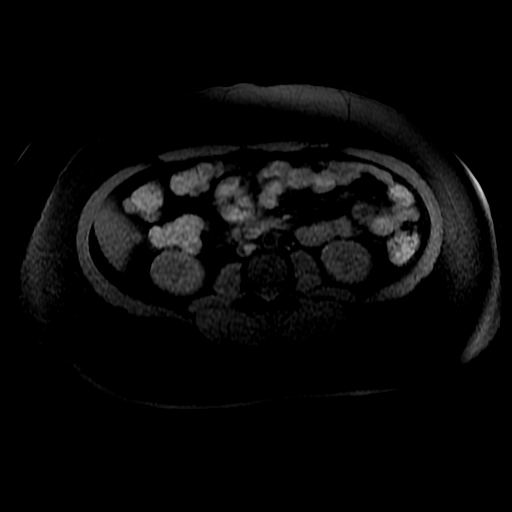
[im 44/88]
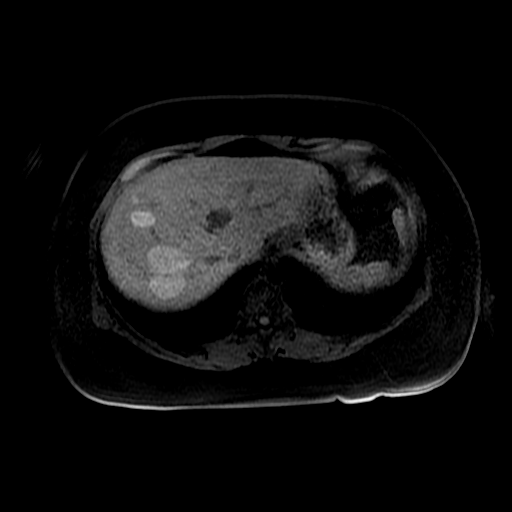
[im 88/88]
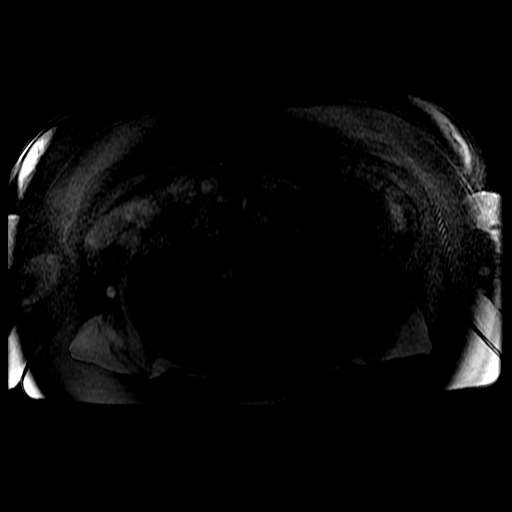

[Series 1000: T1 dynamic · axial · 6.0mm · 0.86mm/px · z∈[-121,+140]mm · 3 of 88 slices shown (2 of 3)]
[im 1/88]
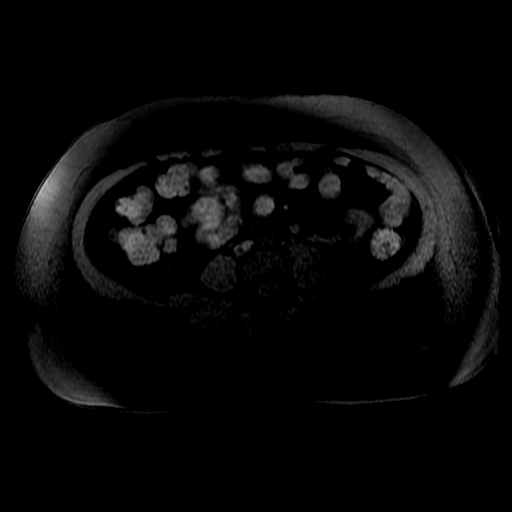
[im 44/88]
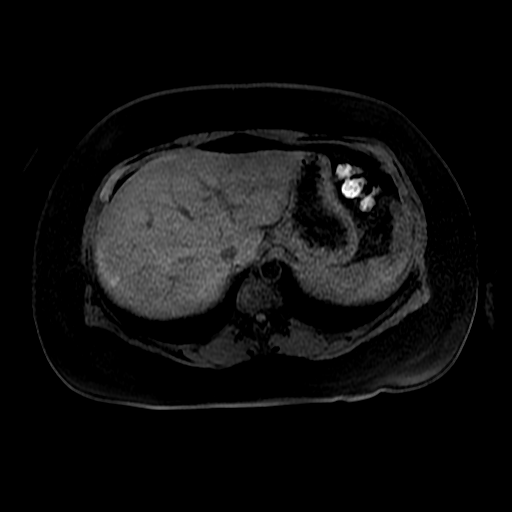
[im 88/88]
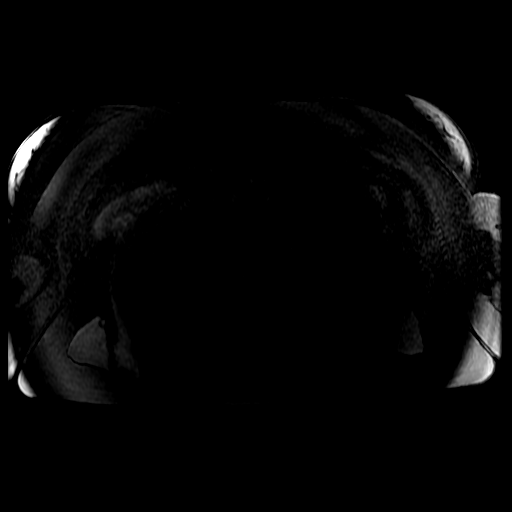

[Series 1001: T1 dynamic · axial · 6.0mm · 0.86mm/px · 1 of 88 slices shown (3 of 3)]
[im 1/88]
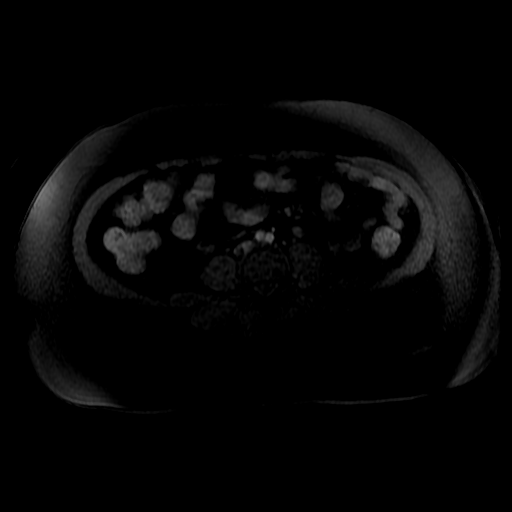

[19 of 48 positions shown; findings below may reference images not displayed]

FINDINGS: Lower chest: No acute abnormality at the lung bases.

Hepatobiliary: Normal liver size and configuration. Marked diffuse
hepatic steatosis on chemical shift imaging. There are numerous (at
least 10) liver masses scattered throughout the liver demonstrating
similar MRI features including subtle T2 hyperintensity, absence of
intralesional lipid on chemical shift imaging, mild fat sat T1
hyperintensity (relative to the steatotic background liver),
relatively uniform arterial phase hyperenhancement and fading to the
precontrast appearance on delayed postcontrast sequences (without
washout or capsular enhancement), most compatible with focal nodular
hyperplasia, largest 4.7 x 4.3 cm in the superior right liver lobe
spanning segments 7 and 8 (series 8/image 24) and 2.2 x 1.5 cm in
the segment 8 right liver lobe (series 8/image 26). There are
several (at least 6) hemangiomas scattered throughout the liver,
largest 2.6 cm in the segment 4A left liver lobe (series 3/image 20)
and 3.2 cm in the segment 3 left liver lobe (series 3/image 30),
each demonstrating characteristic progressive discontinuous
peripheral nodular enhancement. Normal gallbladder with no
cholelithiasis. No biliary ductal dilatation. Common bile duct
diameter 3 mm. No choledocholithiasis.

Pancreas: No pancreatic mass or duct dilation.  No pancreas divisum.

Spleen: Normal size. No mass.

Adrenals/Urinary Tract: Normal adrenals. No hydronephrosis. Normal
kidneys with no renal mass.

Stomach/Bowel: Normal non-distended stomach. Visualized small and
large bowel is normal caliber, with no bowel wall thickening.

Vascular/Lymphatic: Normal caliber abdominal aorta. Patent portal,
splenic, hepatic and renal veins. No pathologically enlarged lymph
nodes in the abdomen.

Other: No abdominal ascites or focal fluid collection.

Musculoskeletal: No aggressive appearing focal osseous lesions.
IMPRESSION: 1. Numerous (at least 10) hypervascular liver masses with MRI
features most suggestive of focal nodular hyperplasia (FNH), with
the differential including lipid poor hepatic adenomas. No overtly
suspicious features. Recommend follow-up MRI abdomen without and
with IV Eovist contrast in 6 months.
2. Numerous liver hemangiomas.
3. Background marked diffuse hepatic steatosis.

## 2020-07-19 ENCOUNTER — Encounter: Payer: 59 | Admitting: Family Medicine

## 2020-08-07 ENCOUNTER — Other Ambulatory Visit: Payer: Self-pay | Admitting: Gastroenterology

## 2020-08-07 DIAGNOSIS — R160 Hepatomegaly, not elsewhere classified: Secondary | ICD-10-CM

## 2020-08-26 ENCOUNTER — Ambulatory Visit
Admission: RE | Admit: 2020-08-26 | Discharge: 2020-08-26 | Disposition: A | Discharge status: Home / Self Care | Payer: No Typology Code available for payment source | Source: Ambulatory Visit | Attending: Gastroenterology | Admitting: Gastroenterology

## 2020-08-26 ENCOUNTER — Other Ambulatory Visit: Payer: Self-pay

## 2020-08-26 DIAGNOSIS — R160 Hepatomegaly, not elsewhere classified: Secondary | ICD-10-CM

## 2020-08-26 MED ORDER — GADOBENATE DIMEGLUMINE 529 MG/ML IV SOLN
19.0000 mL | Freq: Once | INTRAVENOUS | Status: AC | PRN
  Administered 2020-08-26: 19 mL via INTRAVENOUS

## 2020-10-16 ENCOUNTER — Encounter (HOSPITAL_COMMUNITY): Payer: Self-pay | Admitting: Emergency Medicine

## 2020-10-16 ENCOUNTER — Ambulatory Visit (HOSPITAL_COMMUNITY)
Admission: EM | Admit: 2020-10-16 | Discharge: 2020-10-16 | Disposition: A | Discharge status: Home / Self Care | Payer: No Typology Code available for payment source

## 2020-10-16 ENCOUNTER — Other Ambulatory Visit: Payer: Self-pay

## 2020-10-16 DIAGNOSIS — J069 Acute upper respiratory infection, unspecified: Secondary | ICD-10-CM | POA: Diagnosis not present

## 2020-10-16 DIAGNOSIS — H66001 Acute suppurative otitis media without spontaneous rupture of ear drum, right ear: Secondary | ICD-10-CM | POA: Diagnosis not present

## 2020-10-16 MED ORDER — AMOXICILLIN-POT CLAVULANATE 875-125 MG PO TABS: 1.0000 | ORAL_TABLET | Freq: Two times a day (BID) | ORAL | 0 refills | Status: DC

## 2020-10-16 NOTE — ED Triage Notes (Signed)
Onset of symptoms Thursday night.  Patient had a sore throat initially, now gone.  Patient has cough, runny nose, throat congestion, right ear pain, and dizziness since ear has been irritated.

## 2020-10-16 NOTE — ED Provider Notes (Signed)
[facility]    CSN: [id number] Arrival date & time: 10/16/20  1045      History   Chief Complaint Chief Complaint  Patient presents with   Cough    HPI Donna Spencer is a 35 y.o. female.   Patient here c/w cough x 4 days.  Admits nasal congestion, rhinorrhea, R otalgia.  She initially had a sore throat which resolved.   States she had fever initially, tmax < 100.  She reports multiple negative covid tests, including at home 2 days ago.   Past Medical History:  Diagnosis Date   Allergy    Anal fissure    Anemia    Anxiety    Benign hematuria    Mouth ulcers    recurrent, apthous    Patient Active Problem List   Diagnosis Date Noted   Pregnancy 06/26/2020   Dysmenorrhea 02/07/2016   Obesity 01/26/2014   Uses oral contraceptives 01/26/2014   Anxiety    Anal fissure    Benign hematuria    Mouth ulcers     Past Surgical History:  Procedure Laterality Date   CESAREAN SECTION N/A 06/26/2020   Procedure: CESAREAN SECTION;  Surgeon: Carlyon Shadow, MD;  Location: [place] [facility];  Service: Obstetrics;  Laterality: N/A;   REPAIR ANKLE LIGAMENT Right 2002    OB History     Gravida  1   Para  1   Term  1   Preterm      AB      Living  1      SAB      IAB      Ectopic      Multiple  0   Live Births  1            Home Medications    Prior to Admission medications   Medication Sig Start Date End Date Taking? Authorizing Provider  amoxicillin-clavulanate (AUGMENTIN) 875-125 MG tablet Take 1 tablet by mouth every 12 (twelve) hours. 10/16/20  Yes Peri Jefferson, PA-C  Prenatal Vit-Fe Fumarate-FA (PRENATAL MULTIVITAMIN) TABS tablet Take 1 tablet by mouth at bedtime.   Yes [provider]  Pseudoeph-Doxylamine-DM-APAP (NYQUIL PO) Take by mouth.   Yes [provider]  acetaminophen (TYLENOL) 500 MG tablet Take 500 mg by mouth every 6 (six) hours as needed for mild pain or headache.    [provider]  calcium  carbonate (TUMS - DOSED IN MG ELEMENTAL CALCIUM) 500 MG chewable tablet Chew 2 tablets by mouth 4 (four) times daily as needed for indigestion or heartburn.    [provider]  clotrimazole (LOTRIMIN) 1 % cream Apply 1 application topically 2 (two) times daily as needed (For rash).    [provider]  Doxylamine-Pyridoxine 10-10 MG TBEC Take 2 tablets by mouth at bedtime.    [provider]  famotidine (PEPCID) 20 MG tablet Take 20 mg by mouth 2 (two) times daily. Patient not taking: Reported on 10/16/2020    [provider]  ferrous sulfate 325 (65 FE) MG tablet Take 325 mg by mouth at bedtime.    [provider]  hydrocortisone (ANUSOL-HC) 25 MG suppository Place 25 mg rectally 2 (two) times daily as needed for hemorrhoids.    [provider]  hydrocortisone cream 1 % Apply 1 application topically 2 (two) times daily as needed for itching.    [provider]  ibuprofen (ADVIL) 800 MG tablet Take 1 tablet (800 mg total) by mouth every 8 (  eight) hours as needed. 06/28/20   Louretta Shorten, MD  oxyCODONE (OXY IR/ROXICODONE) 5 MG immediate release tablet Take 1-2 tablets (5-10 mg total) by mouth every 4 (four) hours as needed for moderate pain. Patient not taking: Reported on 10/16/2020 06/28/20   Louretta Shorten, MD  simethicone (MYLICON) 80 MG chewable tablet Chew 80 mg by mouth every 6 (six) hours as needed for flatulence.    [provider]    Family History Family History  Problem Relation Age of Onset   Hypertension Mother    Cancer Maternal Grandmother    Breast cancer Maternal Grandmother    GER disease Paternal Grandmother    COPD Paternal Grandfather     Social History Social History   Tobacco Use   Smoking status: Never   Smokeless tobacco: Never  Vaping Use   Vaping Use: Never used  Substance Use Topics   Alcohol use: No    Comment: occasionally - couple times/month (liquor)   Drug use: No     Allergies    Biaxin [clarithromycin], Codeine, Dairy aid [lactase], Flagyl [metronidazole], and Oseltamivir phosphate   Review of Systems Review of Systems  Constitutional:  Positive for chills. Negative for fatigue and fever.  HENT:  Positive for congestion, ear pain and rhinorrhea. Negative for ear discharge, nosebleeds, postnasal drip, sinus pressure, sinus pain and sore throat.   Eyes:  Negative for pain and redness.  Respiratory:  Positive for cough. Negative for shortness of breath and wheezing.   Gastrointestinal:  Positive for diarrhea. Negative for abdominal pain, nausea and vomiting.  Musculoskeletal:  Negative for arthralgias and myalgias.  Skin:  Negative for rash.  Neurological:  Negative for light-headedness and headaches.  Hematological:  Negative for adenopathy. Does not bruise/bleed easily.  Psychiatric/Behavioral:  Negative for confusion and sleep disturbance.     Physical Exam Triage Vital Signs ED Triage Vitals  Enc Vitals Group     BP 10/16/20 1134 103/77     Pulse Rate 10/16/20 1134 84     Resp 10/16/20 1134 20     Temp 10/16/20 1134 98.7 F (37.1 C)     Temp Source 10/16/20 1134 Oral     SpO2 10/16/20 1134 98 %     Weight --      Height --      Head Circumference --      Peak Flow --      Pain Score 10/16/20 1129 0     Pain Loc --      Pain Edu? --      Excl. in [place]? --    No data found.  Updated Vital Signs BP 103/77 (BP Location: Right Arm) Comment (BP Location): large cuff  Pulse 84   Temp 98.7 F (37.1 C) (Oral)   Resp 20   LMP 10/11/2020   SpO2 98%   Breastfeeding No   Visual Acuity Right Eye Distance:   Left Eye Distance:   Bilateral Distance:    Right Eye Near:   Left Eye Near:    Bilateral Near:     Physical Exam Vitals and nursing note reviewed.  Constitutional:      General: She is not in acute distress.    Appearance: Normal appearance. She is well-developed. She is obese. She is not ill-appearing or toxic-appearing.  HENT:      Head: Normocephalic and atraumatic.     Right Ear: Ear canal normal. No swelling or tenderness. No middle ear effusion. Tympanic membrane is erythematous and bulging.  Tympanic membrane is not scarred, perforated or retracted.     Left Ear: Tympanic membrane and ear canal normal. No swelling or tenderness.  No middle ear effusion. Tympanic membrane is not injected, scarred, perforated, retracted or bulging.     Nose: Mucosal edema, congestion and rhinorrhea present. Rhinorrhea is clear.     Right Turbinates: Swollen. Not enlarged.     Left Turbinates: Swollen. Not enlarged.     Right Sinus: No frontal sinus tenderness.     Left Sinus: No maxillary sinus tenderness or frontal sinus tenderness.     Mouth/Throat:     Mouth: Mucous membranes are moist.     Pharynx: Oropharynx is clear. No pharyngeal swelling, oropharyngeal exudate, posterior oropharyngeal erythema or uvula swelling.     Tonsils: No tonsillar exudate or tonsillar abscesses. 0 on the right. 0 on the left.  Eyes:     Extraocular Movements: Extraocular movements intact.     Conjunctiva/sclera: Conjunctivae normal.  Cardiovascular:     Rate and Rhythm: Normal rate and regular rhythm.     Heart sounds: No murmur heard. Pulmonary:     Effort: Pulmonary effort is normal. No respiratory distress.     Breath sounds: Normal breath sounds. No wheezing, rhonchi or rales.  Musculoskeletal:        General: Normal range of motion.     Cervical back: Normal range of motion and neck supple. No rigidity.  Lymphadenopathy:     Cervical: No cervical adenopathy.  Skin:    General: Skin is warm and dry.     Capillary Refill: Capillary refill takes less than 2 seconds.  Neurological:     General: No focal deficit present.     Mental Status: She is alert and oriented to person, place, and time.     Motor: No weakness.     Gait: Gait normal.  Psychiatric:        Mood and Affect: Mood normal.        Behavior: Behavior normal.     UC  Treatments / Results  Labs (all labs ordered are listed, but only abnormal results are displayed) Labs Reviewed - No data to display  EKG   Radiology No results found.  Procedures Procedures (including critical care time)  Medications Ordered in UC Medications - No data to display  Initial Impression / Assessment and Plan / UC Course  I have reviewed the triage vital signs and the nursing notes.  Pertinent labs & imaging results that were available during my care of the patient were reviewed by me and considered in my medical decision making (see chart for details).     R otitis media, will start antibiotics Take zyrtec-d for nasal congestion, rhinorrhea. Final Clinical Impressions(s) / UC Diagnoses   Final diagnoses:  Viral URI with cough  Non-recurrent acute suppurative otitis media of right ear without spontaneous rupture of tympanic membrane     Discharge Instructions      Take medication as prescribed Take zyrtec-D as discussed     ED Prescriptions     Medication Sig Dispense Auth. Provider   amoxicillin-clavulanate (AUGMENTIN) 875-125 MG tablet Take 1 tablet by mouth every 12 (twelve) hours. 20 tablet Peri Jefferson, PA-C      PDMP not reviewed this encounter.   Peri Jefferson, PA-C 10/16/20 1214

## 2020-10-16 NOTE — Discharge Instructions (Addendum)
Take medication as prescribed Take zyrtec-D as discussed

## 2020-10-30 DIAGNOSIS — R0981 Nasal congestion: Secondary | ICD-10-CM | POA: Diagnosis not present

## 2020-10-30 DIAGNOSIS — J029 Acute pharyngitis, unspecified: Secondary | ICD-10-CM | POA: Diagnosis not present

## 2020-10-30 DIAGNOSIS — Z03818 Encounter for observation for suspected exposure to other biological agents ruled out: Secondary | ICD-10-CM | POA: Diagnosis not present

## 2020-10-30 DIAGNOSIS — R509 Fever, unspecified: Secondary | ICD-10-CM | POA: Diagnosis not present

## 2020-10-30 DIAGNOSIS — R059 Cough, unspecified: Secondary | ICD-10-CM | POA: Diagnosis not present

## 2020-11-03 DIAGNOSIS — J069 Acute upper respiratory infection, unspecified: Secondary | ICD-10-CM | POA: Diagnosis not present

## 2020-12-02 ENCOUNTER — Ambulatory Visit: Payer: No Typology Code available for payment source | Admitting: Internal Medicine

## 2020-12-02 ENCOUNTER — Encounter: Payer: Self-pay | Admitting: Internal Medicine

## 2020-12-02 ENCOUNTER — Other Ambulatory Visit: Payer: Self-pay

## 2020-12-02 VITALS — BP 116/80 | HR 92 | Temp 98.7°F | Resp 18 | Ht 59.0 in | Wt 208.0 lb

## 2020-12-02 DIAGNOSIS — R718 Other abnormality of red blood cells: Secondary | ICD-10-CM | POA: Diagnosis not present

## 2020-12-02 DIAGNOSIS — Z0000 Encounter for general adult medical examination without abnormal findings: Secondary | ICD-10-CM

## 2020-12-02 DIAGNOSIS — D72829 Elevated white blood cell count, unspecified: Secondary | ICD-10-CM | POA: Insufficient documentation

## 2020-12-02 DIAGNOSIS — R21 Rash and other nonspecific skin eruption: Secondary | ICD-10-CM | POA: Diagnosis not present

## 2020-12-02 LAB — CBC WITH DIFFERENTIAL/PLATELET
Basophils Absolute: 0 10*3/uL (ref 0.0–0.1)
Basophils Relative: 0.4 % (ref 0.0–3.0)
Eosinophils Absolute: 0.3 10*3/uL (ref 0.0–0.7)
Eosinophils Relative: 2.5 % (ref 0.0–5.0)
HCT: 37.4 % (ref 36.0–46.0)
Hemoglobin: 12.6 g/dL (ref 12.0–15.0)
Lymphocytes Relative: 24.8 % (ref 12.0–46.0)
Lymphs Abs: 2.9 10*3/uL (ref 0.7–4.0)
MCHC: 33.6 g/dL (ref 30.0–36.0)
MCV: 86.4 fl (ref 78.0–100.0)
Monocytes Absolute: 0.6 10*3/uL (ref 0.1–1.0)
Monocytes Relative: 5.4 % (ref 3.0–12.0)
Neutro Abs: 7.7 10*3/uL (ref 1.4–7.7)
Neutrophils Relative %: 66.9 % (ref 43.0–77.0)
Platelets: 481 10*3/uL — ABNORMAL HIGH (ref 150.0–400.0)
RBC: 4.33 Mil/uL (ref 3.87–5.11)
RDW: 13.8 % (ref 11.5–15.5)
WBC: 11.6 10*3/uL — ABNORMAL HIGH (ref 4.0–10.5)

## 2020-12-02 LAB — COMPREHENSIVE METABOLIC PANEL
ALT: 19 U/L (ref 0–35)
AST: 16 U/L (ref 0–37)
Albumin: 4.3 g/dL (ref 3.5–5.2)
Alkaline Phosphatase: 66 U/L (ref 39–117)
BUN: 11 mg/dL (ref 6–23)
CO2: 29 mEq/L (ref 19–32)
Calcium: 9.7 mg/dL (ref 8.4–10.5)
Chloride: 99 mEq/L (ref 96–112)
Creatinine, Ser: 0.7 mg/dL (ref 0.40–1.20)
GFR: 113.26 mL/min (ref >60.00)
Glucose, Bld: 90 mg/dL (ref 70–99)
Potassium: 4.3 mEq/L (ref 3.5–5.1)
Sodium: 136 mEq/L (ref 135–145)
Total Bilirubin: 0.4 mg/dL (ref 0.2–1.2)
Total Protein: 8.1 g/dL (ref 6.0–8.3)

## 2020-12-02 LAB — LIPID PANEL
Cholesterol: 216 mg/dL — ABNORMAL HIGH (ref 0–200)
HDL: 50.8 mg/dL (ref >39.00)
LDL Cholesterol: 149 mg/dL — ABNORMAL HIGH (ref 0–99)
NonHDL: 165.42
Total CHOL/HDL Ratio: 4
Triglycerides: 83 mg/dL (ref 0.0–149.0)
VLDL: 16.6 mg/dL (ref 0.0–40.0)

## 2020-12-02 MED ORDER — TRIAMCINOLONE ACETONIDE 0.1 % EX CREA: 1.0000 "application " | TOPICAL_CREAM | Freq: Two times a day (BID) | CUTANEOUS | 0 refills | Status: DC

## 2020-12-02 NOTE — Assessment & Plan Note (Signed)
Present since pregnancy. Rx triamcinolone ointment to help.

## 2020-12-02 NOTE — Patient Instructions (Signed)
We have sent in a cream to use twice a day on the rash to see if this helps.  We will check the labs today.

## 2020-12-02 NOTE — Assessment & Plan Note (Signed)
BMI 42 we did discuss self care and diet/exercise to help.

## 2020-12-02 NOTE — Assessment & Plan Note (Signed)
Flu shot yearly. Covid-19 up to date 3 shots. Tetanus up to date. Pap smear up to date. Counseled about sun safety and mole surveillance. Counseled about the dangers of distracted driving. Given 10 year screening recommendations.

## 2020-12-02 NOTE — Progress Notes (Signed)
   Subjective:   Patient ID: Donna Spencer, female    DOB: 10/05/1985, 35 y.o.   MRN: [id number]  HPI The patient is a new 35 YO female coming in for physical.   PMH, [place], social history reviewed and updated  Review of Systems  Constitutional: Negative.   HENT: Negative.    Eyes: Negative.   Respiratory:  Negative for cough, chest tightness and shortness of breath.   Cardiovascular:  Negative for chest pain, palpitations and leg swelling.  Gastrointestinal:  Negative for abdominal distention, abdominal pain, constipation, diarrhea, nausea and vomiting.  Musculoskeletal: Negative.   Skin:  Positive for rash.  Neurological: Negative.   Psychiatric/Behavioral: Negative.     Objective:  Physical Exam Constitutional:      Appearance: She is well-developed. She is obese.  HENT:     Head: Normocephalic and atraumatic.  Cardiovascular:     Rate and Rhythm: Normal rate and regular rhythm.  Pulmonary:     Effort: Pulmonary effort is normal. No respiratory distress.     Breath sounds: Normal breath sounds. No wheezing or rales.  Abdominal:     General: Bowel sounds are normal. There is no distension.     Palpations: Abdomen is soft.     Tenderness: There is no abdominal tenderness. There is no rebound.  Musculoskeletal:     Cervical back: Normal range of motion.  Skin:    General: Skin is warm and dry.     Comments: Rash axillary region without satellite lesions or itching or pain.   Neurological:     Mental Status: She is alert and oriented to person, place, and time.     Coordination: Coordination normal.    Vitals:   12/02/20 0809  BP: 116/80  Pulse: 92  Resp: 18  Temp: 98.7 F (37.1 C)  TempSrc: Oral  SpO2: 98%  Weight: 208 lb (94.3 kg)  Height: '4\' 11"'$  (1.499 m)    This visit occurred during the SARS-CoV-2 public health emergency.  Safety protocols were in place, including screening questions prior to the visit, additional usage of staff PPE, and extensive  cleaning of exam room while observing appropriate contact time as indicated for disinfecting solutions.   Assessment & Plan:

## 2020-12-02 NOTE — Assessment & Plan Note (Signed)
Noted in past history. Checking CBC with diff and smear review today.

## 2020-12-03 LAB — PATHOLOGIST SMEAR REVIEW

## 2021-01-20 DIAGNOSIS — F321 Major depressive disorder, single episode, moderate: Secondary | ICD-10-CM | POA: Diagnosis not present

## 2021-01-24 DIAGNOSIS — F53 Postpartum depression: Secondary | ICD-10-CM | POA: Diagnosis not present

## 2021-02-07 DIAGNOSIS — F321 Major depressive disorder, single episode, moderate: Secondary | ICD-10-CM | POA: Diagnosis not present

## 2021-02-11 DIAGNOSIS — F32A Depression, unspecified: Secondary | ICD-10-CM | POA: Diagnosis not present

## 2021-02-14 DIAGNOSIS — F321 Major depressive disorder, single episode, moderate: Secondary | ICD-10-CM | POA: Diagnosis not present

## 2021-02-22 DIAGNOSIS — F321 Major depressive disorder, single episode, moderate: Secondary | ICD-10-CM | POA: Diagnosis not present

## 2021-02-26 DIAGNOSIS — F321 Major depressive disorder, single episode, moderate: Secondary | ICD-10-CM | POA: Diagnosis not present

## 2021-04-03 DIAGNOSIS — F321 Major depressive disorder, single episode, moderate: Secondary | ICD-10-CM | POA: Diagnosis not present

## 2021-04-29 DIAGNOSIS — J069 Acute upper respiratory infection, unspecified: Secondary | ICD-10-CM | POA: Diagnosis not present

## 2021-05-01 DIAGNOSIS — F321 Major depressive disorder, single episode, moderate: Secondary | ICD-10-CM | POA: Diagnosis not present

## 2021-05-15 DIAGNOSIS — F321 Major depressive disorder, single episode, moderate: Secondary | ICD-10-CM | POA: Diagnosis not present

## 2021-05-29 DIAGNOSIS — F321 Major depressive disorder, single episode, moderate: Secondary | ICD-10-CM | POA: Diagnosis not present

## 2021-06-22 DIAGNOSIS — Z1152 Encounter for screening for COVID-19: Secondary | ICD-10-CM | POA: Diagnosis not present

## 2021-06-22 IMAGING — DX DG CHEST 2V
2 series · 2 of 2 positions shown · non-contrast
Comparison: Chest radiograph dated 08/22/2014.

CLINICAL DATA: 33-year-old female with fever and cough and
shortness of breath.

EXAM:
CHEST - 2 VIEW

[chest pa]
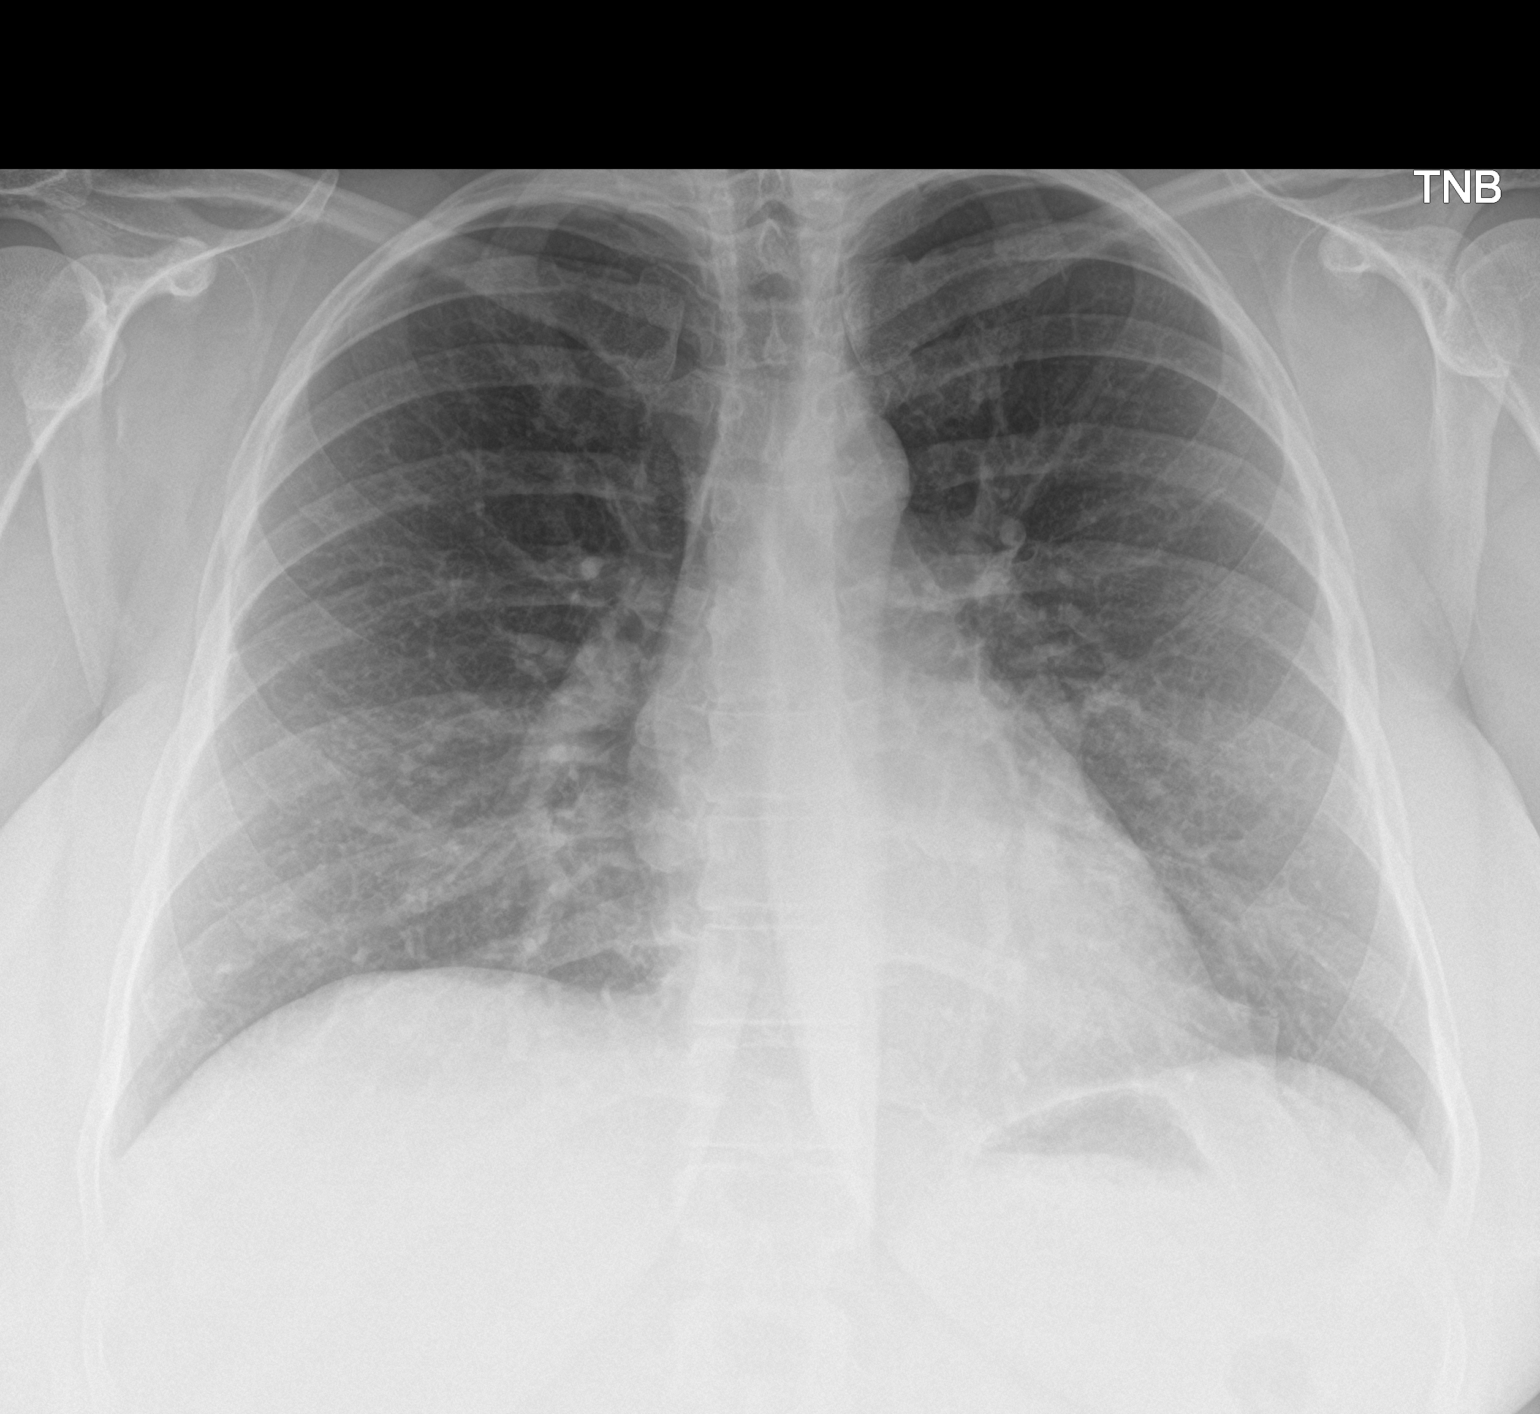

[chest lat]
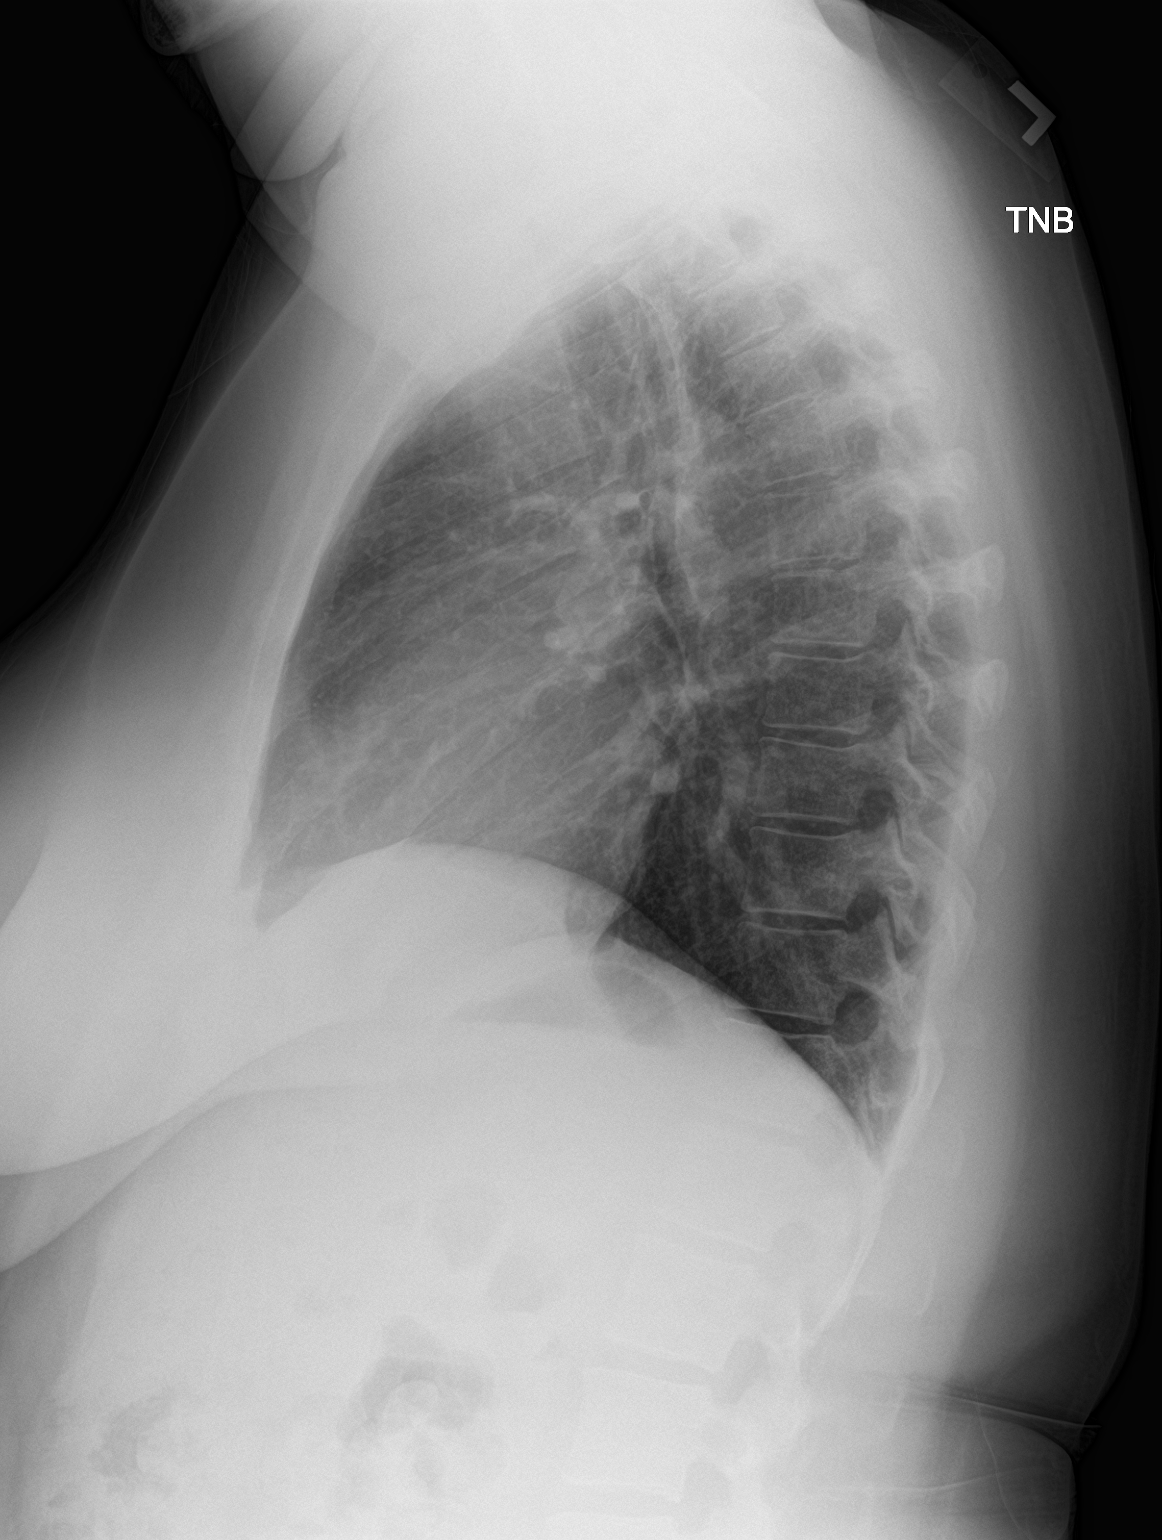

[2 of 2 positions shown; findings below may reference images not displayed]

FINDINGS: The heart size and mediastinal contours are within normal limits.
Both lungs are clear. The visualized skeletal structures are
unremarkable.
IMPRESSION: No active cardiopulmonary disease.

## 2021-07-07 DIAGNOSIS — J019 Acute sinusitis, unspecified: Secondary | ICD-10-CM | POA: Diagnosis not present

## 2021-07-07 DIAGNOSIS — R5383 Other fatigue: Secondary | ICD-10-CM | POA: Diagnosis not present

## 2021-07-10 DIAGNOSIS — F321 Major depressive disorder, single episode, moderate: Secondary | ICD-10-CM | POA: Diagnosis not present

## 2021-07-29 DIAGNOSIS — J020 Streptococcal pharyngitis: Secondary | ICD-10-CM | POA: Diagnosis not present

## 2021-07-29 DIAGNOSIS — J029 Acute pharyngitis, unspecified: Secondary | ICD-10-CM | POA: Diagnosis not present

## 2021-08-07 DIAGNOSIS — F321 Major depressive disorder, single episode, moderate: Secondary | ICD-10-CM | POA: Diagnosis not present

## 2021-08-11 DIAGNOSIS — J020 Streptococcal pharyngitis: Secondary | ICD-10-CM | POA: Diagnosis not present

## 2021-08-11 DIAGNOSIS — J029 Acute pharyngitis, unspecified: Secondary | ICD-10-CM | POA: Diagnosis not present

## 2021-08-18 DIAGNOSIS — F321 Major depressive disorder, single episode, moderate: Secondary | ICD-10-CM | POA: Diagnosis not present

## 2021-09-18 DIAGNOSIS — F321 Major depressive disorder, single episode, moderate: Secondary | ICD-10-CM | POA: Diagnosis not present

## 2021-10-08 DIAGNOSIS — Z3169 Encounter for other general counseling and advice on procreation: Secondary | ICD-10-CM | POA: Diagnosis not present

## 2021-10-08 DIAGNOSIS — N926 Irregular menstruation, unspecified: Secondary | ICD-10-CM | POA: Diagnosis not present

## 2021-10-08 DIAGNOSIS — Z01419 Encounter for gynecological examination (general) (routine) without abnormal findings: Secondary | ICD-10-CM | POA: Diagnosis not present

## 2021-10-08 DIAGNOSIS — Z6841 Body Mass Index (BMI) 40.0 and over, adult: Secondary | ICD-10-CM | POA: Diagnosis not present

## 2021-10-08 DIAGNOSIS — F32A Depression, unspecified: Secondary | ICD-10-CM | POA: Diagnosis not present

## 2021-10-16 DIAGNOSIS — F321 Major depressive disorder, single episode, moderate: Secondary | ICD-10-CM | POA: Diagnosis not present

## 2021-10-30 DIAGNOSIS — F321 Major depressive disorder, single episode, moderate: Secondary | ICD-10-CM | POA: Diagnosis not present

## 2021-11-12 DIAGNOSIS — J0301 Acute recurrent streptococcal tonsillitis: Secondary | ICD-10-CM | POA: Diagnosis not present

## 2021-11-12 DIAGNOSIS — J029 Acute pharyngitis, unspecified: Secondary | ICD-10-CM | POA: Diagnosis not present

## 2021-11-12 DIAGNOSIS — Z6841 Body Mass Index (BMI) 40.0 and over, adult: Secondary | ICD-10-CM | POA: Diagnosis not present

## 2021-11-26 ENCOUNTER — Encounter: Payer: Self-pay | Admitting: Emergency Medicine

## 2021-11-26 ENCOUNTER — Ambulatory Visit: Payer: BC Managed Care – PPO | Admitting: Emergency Medicine

## 2021-11-26 VITALS — BP 128/76 | HR 92 | Temp 98.8°F | Ht 59.0 in | Wt 212.2 lb

## 2021-11-26 DIAGNOSIS — B349 Viral infection, unspecified: Secondary | ICD-10-CM | POA: Diagnosis not present

## 2021-11-26 DIAGNOSIS — R5383 Other fatigue: Secondary | ICD-10-CM | POA: Insufficient documentation

## 2021-11-26 DIAGNOSIS — J029 Acute pharyngitis, unspecified: Secondary | ICD-10-CM | POA: Diagnosis not present

## 2021-11-26 LAB — CBC WITH DIFFERENTIAL/PLATELET
Basophils Absolute: 0 10*3/uL (ref 0.0–0.1)
Basophils Relative: 0.5 % (ref 0.0–3.0)
Eosinophils Absolute: 0.2 10*3/uL (ref 0.0–0.7)
Eosinophils Relative: 2.4 % (ref 0.0–5.0)
HCT: 38.2 % (ref 36.0–46.0)
Hemoglobin: 12.7 g/dL (ref 12.0–15.0)
Lymphocytes Relative: 23.6 % (ref 12.0–46.0)
Lymphs Abs: 2.4 10*3/uL (ref 0.7–4.0)
MCHC: 33.3 g/dL (ref 30.0–36.0)
MCV: 87.9 fl (ref 78.0–100.0)
Monocytes Absolute: 0.5 10*3/uL (ref 0.1–1.0)
Monocytes Relative: 5.4 % (ref 3.0–12.0)
Neutro Abs: 6.9 10*3/uL (ref 1.4–7.7)
Neutrophils Relative %: 68.1 % (ref 43.0–77.0)
Platelets: 424 10*3/uL — ABNORMAL HIGH (ref 150.0–400.0)
RBC: 4.35 Mil/uL (ref 3.87–5.11)
RDW: 14.2 % (ref 11.5–15.5)
WBC: 10.1 10*3/uL (ref 4.0–10.5)

## 2021-11-26 LAB — TSH: TSH: 1.59 u[IU]/mL (ref 0.35–5.50)

## 2021-11-26 LAB — COMPREHENSIVE METABOLIC PANEL
ALT: 19 U/L (ref 0–35)
AST: 19 U/L (ref 0–37)
Albumin: 4.3 g/dL (ref 3.5–5.2)
Alkaline Phosphatase: 61 U/L (ref 39–117)
BUN: 13 mg/dL (ref 6–23)
CO2: 30 mEq/L (ref 19–32)
Calcium: 9.6 mg/dL (ref 8.4–10.5)
Chloride: 101 mEq/L (ref 96–112)
Creatinine, Ser: 0.67 mg/dL (ref 0.40–1.20)
GFR: 113.67 mL/min (ref >60.00)
Glucose, Bld: 91 mg/dL (ref 70–99)
Potassium: 4.2 mEq/L (ref 3.5–5.1)
Sodium: 137 mEq/L (ref 135–145)
Total Bilirubin: 0.5 mg/dL (ref 0.2–1.2)
Total Protein: 8.2 g/dL (ref 6.0–8.3)

## 2021-11-26 LAB — POCT RAPID STREP A (OFFICE): Rapid Strep A Screen: NEGATIVE

## 2021-11-26 NOTE — Progress Notes (Signed)
Donna Spencer 36 y.o.   Chief Complaint  Patient presents with   Sore Throat    Poss strep throat, symptoms started yesterday   Nasal Congestion   Cough    HISTORY OF PRESENT ILLNESS: Acute problem visit today.  Patient of Dr. Sharlet Salina. This is a 36 y.o. female complaining of sore throat since yesterday. Had strep throat 2 weeks ago was treated with Zithromax. Complaining of nasal congestion and mild cough Also complaining of lack of energy for weeks Married mother of 74-year-old son Also has full-time job Sleeps well.  Could eat better.  Non-smoker. Takes Zoloft 50 mg daily and sees a therapist on a regular basis.  Sore Throat  Associated symptoms include coughing. Pertinent negatives include no abdominal pain, diarrhea, headaches or vomiting.  Cough Associated symptoms include a sore throat. Pertinent negatives include no chest pain, chills, fever, headaches or rash.     Prior to Admission medications   Medication Sig Start Date End Date Taking? Authorizing Provider  sertraline (ZOLOFT) 50 MG tablet Take 50 mg by mouth daily.   Yes [provider]    Allergies  Allergen Reactions   Biaxin [Clarithromycin] Other (See Comments)    Reaction unknown to patient, reported as a baby   Dairy Aid [Tilactase] Swelling    Avoid dairy all dairy products   Flagyl [Metronidazole] Nausea And Vomiting and Other (See Comments)    Fever   Oseltamivir Phosphate Other (See Comments)    Lesion on genitals    Patient Active Problem List   Diagnosis Date Noted   Leukocytosis 12/02/2020   Rash 12/02/2020   Routine general medical examination at a health care facility 12/02/2020   Morbid obesity ([place]) 01/26/2014   Benign hematuria     Past Medical History:  Diagnosis Date   Allergy    Anal fissure    Anemia    Anxiety    Benign hematuria    Mouth ulcers    recurrent, apthous    Past Surgical History:  Procedure Laterality Date   CESAREAN SECTION N/A 06/26/2020    Procedure: CESAREAN SECTION;  Surgeon: Carlyon Shadow, MD;  Location: [facility] LD ORS;  Service: Obstetrics;  Laterality: N/A;   REPAIR ANKLE LIGAMENT Right 2002    Social History   Socioeconomic History   Marital status: Married    Spouse name: Iona Beard    Number of children: Not on file   Years of education: Doctorate   Highest education level: Not on file  Occupational History   Occupation: Attorney   Tobacco Use   Smoking status: Never   Smokeless tobacco: Never  Vaping Use   Vaping Use: Never used  Substance and Sexual Activity   Alcohol use: No    Comment: occasionally - couple times/month (liquor)   Drug use: No   Sexual activity: Yes    Birth control/protection: Condom    Comment: may want to consider pill again   Other Topics Concern   Not on file  Social History Narrative   Not on file   Social Determinants of Health   Financial Resource Strain: Not on file  Food Insecurity: Not on file  Transportation Needs: Not on file  Physical Activity: Not on file  Stress: Not on file  Social Connections: Not on file  Intimate Partner Violence: Not on file    Family History  Problem Relation Age of Onset   Hypertension Mother    Cancer Maternal Grandmother    Breast cancer Maternal Grandmother  GER disease Paternal Grandmother    COPD Paternal Grandfather      Review of Systems  Constitutional:  Positive for malaise/fatigue. Negative for chills and fever.  HENT:  Positive for sore throat.   Respiratory:  Positive for cough.   Cardiovascular: Negative.  Negative for chest pain and palpitations.  Gastrointestinal:  Negative for abdominal pain, diarrhea, nausea and vomiting.  Genitourinary: Negative.   Skin: Negative.  Negative for rash.  Neurological: Negative.  Negative for dizziness and headaches.  All other systems reviewed and are negative.  Today's Vitals   11/26/21 0942  BP: 128/76  Pulse: 92  Temp: 98.8 F (37.1 C)  TempSrc: Oral  SpO2: 98%   Weight: 212 lb 4 oz (96.3 kg)  Height: '4\' 11"'$  (1.499 m)   Body mass index is 42.87 kg/m.   Physical Exam Vitals reviewed.  Constitutional:      Appearance: She is well-developed. She is obese.  HENT:     Head: Normocephalic.     Mouth/Throat:     Mouth: Mucous membranes are moist.     Pharynx: Oropharynx is clear. No oropharyngeal exudate or posterior oropharyngeal erythema.  Eyes:     Extraocular Movements: Extraocular movements intact.     Conjunctiva/sclera: Conjunctivae normal.     Pupils: Pupils are equal, round, and reactive to light.  Cardiovascular:     Rate and Rhythm: Normal rate and regular rhythm.     Pulses: Normal pulses.     Heart sounds: Normal heart sounds.  Pulmonary:     Effort: Pulmonary effort is normal.     Breath sounds: Normal breath sounds.  Musculoskeletal:     Cervical back: No tenderness.  Lymphadenopathy:     Cervical: No cervical adenopathy.  Skin:    General: Skin is warm and dry.     Capillary Refill: Capillary refill takes less than 2 seconds.  Neurological:     Mental Status: She is alert and oriented to person, place, and time.  Psychiatric:        Mood and Affect: Mood normal.        Behavior: Behavior normal.   Results for orders placed or performed in visit on 11/26/21 (from the past 24 hour(s))  POCT rapid strep A     Status: Normal   Collection Time: 11/26/21 10:20 AM  Result Value Ref Range   Rapid Strep A Screen Negative Negative     ASSESSMENT & PLAN: Problem List Items Addressed This Visit       Respiratory   Viral pharyngitis - Primary    Clinically stable.  No red flag signs or symptoms.   Negative rapid strep test.  No clinical criteria for strep throat.  Afebrile.  No exudates and no lymphadenopathy. Viral illness that needs to run its course. Advised to rest and stay well-hydrated. Tylenol as needed. Monotest done today along with CBC. Advised to contact the office if no better or worse during the next  several days.      Relevant Orders   POCT rapid strep A (Completed)   CBC with Differential/Platelet   Comprehensive metabolic panel   TSH   Mononucleosis Test, Qual W/ Reflex     Other   Lack of energy    Multifactorial.  Differential diagnosis discussed. Diet and nutrition discussed.  Advised to decrease amount of daily carbohydrate intake and daily calories. Blood work done Museum/gallery exhibitions officer. Stress management discussed.      Relevant Orders   CBC with Differential/Platelet  Comprehensive metabolic panel   TSH   Mononucleosis Test, Qual W/ Reflex   Viral illness   Relevant Orders   CBC with Differential/Platelet   Comprehensive metabolic panel   TSH   Mononucleosis Test, Qual W/ Reflex   Patient Instructions  Pharyngitis  Pharyngitis is a sore throat (pharynx). This is when there is redness, pain, and swelling in your throat. Most of the time, this condition gets better on its own. In some cases, you may need medicine. What are the causes? An infection from a virus. An infection from bacteria. Allergies. What increases the risk? Being 71-38 years old. Being in crowded environments. These include: Daycares. Schools. Dormitories. Living in a place with cold temperatures outside. Having a weakened disease-fighting (immune) system. What are the signs or symptoms? Symptoms may vary depending on the cause. Common symptoms include: Sore throat. Tiredness (fatigue). Low-grade fever. Stuffy nose. Cough. Headache. Other symptoms may include: Glands in the neck (lymph nodes) that are swollen. Skin rashes. Film on the throat or tonsils. This can be caused by an infection from bacteria. Vomiting. Red, itchy eyes. Loss of appetite. Joint pain and muscle aches. Tonsils that are temporarily bigger than usual (enlarged). How is this treated? Many times, treatment is not needed. This condition usually gets better in 3-4 days without treatment. If the infection  is caused by a bacteria, you may be need to take antibiotics. Follow these instructions at home: Medicines Take over-the-counter and prescription medicines only as told by your doctor. If you were prescribed an antibiotic medicine, take it as told by your doctor. Do not stop taking the antibiotic even if you start to feel better. Use throat lozenges or sprays to soothe your throat as told by your doctor. Children can get pharyngitis. Do not give your child aspirin. Managing pain To help with pain, try: Sipping warm liquids, such as: Broth. Herbal tea. Warm water. Eating or drinking cold or frozen liquids, such as frozen ice pops. Rinsing your mouth (gargle) with a salt water mixture 3-4 times a day or as needed. To make salt water, dissolve -1 tsp (3-6 g) of salt in 1 cup (237 mL) of warm water. Do not swallow this mixture. Sucking on hard candy or throat lozenges. Putting a cool-mist humidifier in your bedroom at night to moisten the air. Sitting in the bathroom with the door closed for 5-10 minutes while you run hot water in the shower.  General instructions  Do not smoke or use any products that contain nicotine or tobacco. If you need help quitting, ask your doctor. Rest as told by your doctor. Drink enough fluid to keep your pee (urine) pale yellow. How is this prevented? Wash your hands often for at least 20 seconds with soap and water. If soap and water are not available, use hand sanitizer. Do not touch your eyes, nose, or mouth with unwashed hands. Wash hands after touching these areas. Do not share cups or eating utensils. Avoid close contact with people who are sick. Contact a doctor if: You have large, tender lumps in your neck. You have a rash. You cough up green, yellow-brown, or bloody spit. Get help right away if: You have a stiff neck. You drool or cannot swallow liquids. You cannot drink or take medicines without vomiting. You have very bad pain that does not  go away with medicine. You have problems breathing, and it is not from a stuffy nose. You have new pain and swelling in your knees, ankles, wrists,  or elbows. These symptoms may be an emergency. Get help right away. Call your local emergency services (911 in the [place] Do not wait to see if the symptoms will go away. Do not drive yourself to the hospital. Summary Pharyngitis is a sore throat (pharynx). This is when there is redness, pain, and swelling in your throat. Most of the time, pharyngitis gets better on its own. Sometimes, you may need medicine. If you were prescribed an antibiotic medicine, take it as told by your doctor. Do not stop taking the antibiotic even if you start to feel better. This information is not intended to replace advice given to you by your health care provider. Make sure you discuss any questions you have with your health care provider. Document Revised: 07/10/2020 Document Reviewed: 07/10/2020 Elsevier Patient Education  [place], MD [facility]

## 2021-11-26 NOTE — Assessment & Plan Note (Signed)
Clinically stable.  No red flag signs or symptoms.   Negative rapid strep test.  No clinical criteria for strep throat.  Afebrile.  No exudates and no lymphadenopathy. Viral illness that needs to run its course. Advised to rest and stay well-hydrated. Tylenol as needed. Monotest done today along with CBC. Advised to contact the office if no better or worse during the next several days.

## 2021-11-26 NOTE — Assessment & Plan Note (Signed)
Multifactorial.  Differential diagnosis discussed. Diet and nutrition discussed.  Advised to decrease amount of daily carbohydrate intake and daily calories. Blood work done Museum/gallery exhibitions officer. Stress management discussed.

## 2021-11-26 NOTE — Patient Instructions (Signed)

## 2021-11-27 DIAGNOSIS — F321 Major depressive disorder, single episode, moderate: Secondary | ICD-10-CM | POA: Diagnosis not present

## 2021-11-27 LAB — MONO QUAL W/RFLX QN: Mono Qual W/Rflx Qn: NEGATIVE

## 2021-12-13 DIAGNOSIS — F321 Major depressive disorder, single episode, moderate: Secondary | ICD-10-CM | POA: Diagnosis not present

## 2022-01-14 DIAGNOSIS — F321 Major depressive disorder, single episode, moderate: Secondary | ICD-10-CM | POA: Diagnosis not present

## 2022-01-19 DIAGNOSIS — F321 Major depressive disorder, single episode, moderate: Secondary | ICD-10-CM | POA: Diagnosis not present

## 2022-01-21 DIAGNOSIS — Z8709 Personal history of other diseases of the respiratory system: Secondary | ICD-10-CM | POA: Diagnosis not present

## 2022-01-21 DIAGNOSIS — E669 Obesity, unspecified: Secondary | ICD-10-CM | POA: Diagnosis not present

## 2022-01-21 DIAGNOSIS — Z0000 Encounter for general adult medical examination without abnormal findings: Secondary | ICD-10-CM | POA: Diagnosis not present

## 2022-01-30 DIAGNOSIS — Z803 Family history of malignant neoplasm of breast: Secondary | ICD-10-CM | POA: Diagnosis not present

## 2022-01-30 DIAGNOSIS — Z8049 Family history of malignant neoplasm of other genital organs: Secondary | ICD-10-CM | POA: Diagnosis not present

## 2022-01-30 DIAGNOSIS — N939 Abnormal uterine and vaginal bleeding, unspecified: Secondary | ICD-10-CM | POA: Diagnosis not present

## 2022-01-30 DIAGNOSIS — F321 Major depressive disorder, single episode, moderate: Secondary | ICD-10-CM | POA: Diagnosis not present

## 2022-01-30 DIAGNOSIS — Z800 Family history of malignant neoplasm of digestive organs: Secondary | ICD-10-CM | POA: Diagnosis not present

## 2022-02-06 DIAGNOSIS — F321 Major depressive disorder, single episode, moderate: Secondary | ICD-10-CM | POA: Diagnosis not present

## 2022-02-20 DIAGNOSIS — F321 Major depressive disorder, single episode, moderate: Secondary | ICD-10-CM | POA: Diagnosis not present

## 2022-03-05 DIAGNOSIS — Z6841 Body Mass Index (BMI) 40.0 and over, adult: Secondary | ICD-10-CM | POA: Diagnosis not present

## 2022-03-05 DIAGNOSIS — H66001 Acute suppurative otitis media without spontaneous rupture of ear drum, right ear: Secondary | ICD-10-CM | POA: Diagnosis not present

## 2022-03-07 DIAGNOSIS — F321 Major depressive disorder, single episode, moderate: Secondary | ICD-10-CM | POA: Diagnosis not present

## 2022-03-13 DIAGNOSIS — Z809 Family history of malignant neoplasm, unspecified: Secondary | ICD-10-CM | POA: Diagnosis not present

## 2022-03-28 DIAGNOSIS — Z6841 Body Mass Index (BMI) 40.0 and over, adult: Secondary | ICD-10-CM | POA: Diagnosis not present

## 2022-03-28 DIAGNOSIS — J069 Acute upper respiratory infection, unspecified: Secondary | ICD-10-CM | POA: Diagnosis not present

## 2022-03-28 DIAGNOSIS — R051 Acute cough: Secondary | ICD-10-CM | POA: Diagnosis not present

## 2022-04-03 DIAGNOSIS — F321 Major depressive disorder, single episode, moderate: Secondary | ICD-10-CM | POA: Diagnosis not present

## 2022-04-07 DIAGNOSIS — J209 Acute bronchitis, unspecified: Secondary | ICD-10-CM | POA: Diagnosis not present

## 2022-04-07 DIAGNOSIS — R0602 Shortness of breath: Secondary | ICD-10-CM | POA: Diagnosis not present

## 2022-04-07 DIAGNOSIS — Z6841 Body Mass Index (BMI) 40.0 and over, adult: Secondary | ICD-10-CM | POA: Diagnosis not present

## 2022-04-17 DIAGNOSIS — F321 Major depressive disorder, single episode, moderate: Secondary | ICD-10-CM | POA: Diagnosis not present

## 2022-05-01 DIAGNOSIS — Z1231 Encounter for screening mammogram for malignant neoplasm of breast: Secondary | ICD-10-CM | POA: Diagnosis not present

## 2022-05-04 ENCOUNTER — Other Ambulatory Visit: Payer: Self-pay | Admitting: Obstetrics and Gynecology

## 2022-05-04 DIAGNOSIS — R928 Other abnormal and inconclusive findings on diagnostic imaging of breast: Secondary | ICD-10-CM

## 2022-05-05 ENCOUNTER — Ambulatory Visit
Admission: RE | Admit: 2022-05-05 | Discharge: 2022-05-05 | Disposition: A | Discharge status: Home / Self Care | Payer: BC Managed Care – PPO | Source: Ambulatory Visit | Attending: Obstetrics and Gynecology | Admitting: Obstetrics and Gynecology

## 2022-05-05 DIAGNOSIS — R928 Other abnormal and inconclusive findings on diagnostic imaging of breast: Secondary | ICD-10-CM

## 2022-05-05 DIAGNOSIS — N6489 Other specified disorders of breast: Secondary | ICD-10-CM | POA: Diagnosis not present

## 2022-05-09 ENCOUNTER — Other Ambulatory Visit: Payer: BC Managed Care – PPO

## 2022-05-15 DIAGNOSIS — R0981 Nasal congestion: Secondary | ICD-10-CM | POA: Diagnosis not present

## 2022-05-15 DIAGNOSIS — R059 Cough, unspecified: Secondary | ICD-10-CM | POA: Diagnosis not present

## 2022-05-15 DIAGNOSIS — R0982 Postnasal drip: Secondary | ICD-10-CM | POA: Diagnosis not present

## 2022-05-15 DIAGNOSIS — F321 Major depressive disorder, single episode, moderate: Secondary | ICD-10-CM | POA: Diagnosis not present

## 2022-05-17 ENCOUNTER — Encounter: Payer: Self-pay | Admitting: Internal Medicine

## 2022-05-17 NOTE — Progress Notes (Signed)
Subjective:    Patient ID: Donna Spencer, female    DOB: 03-Jan-1986, 37 y.o.   MRN: [id number]      HPI Donna Spencer is here for  Chief Complaint  Patient presents with   Cough    With mucus since thanksgiving took antibiotics having pain in right rib side thinkjs she bruised it from coughing    She has been sick since thanksgiving.  Her son had rsv just before she got sick, so she assumed she had RSV.  Ended up turning into a bronchitis and she was seen at that time and prescribed antibiotics and steroids.  Her symptoms improved, but she has had persistent nasal congestion, PND since then.  She is coughing and is typically bringing something up with the cough.   Went to urgent care -she was prescribed nasal spray, tessalon.  Taking mucinex, sudafed.  Everything helps, but temporarily.  Nothing is actually getting rid of her symptoms.   Coughing a lot last Friday -3 days ago - pain in right lateral ribs since then.  It was very painful.  She did hear a pop. It is worse at night - sleeping.    Medications and allergies reviewed with patient and updated if appropriate.  Current Outpatient Medications on File Prior to Visit  Medication Sig Dispense Refill   benzonatate (TESSALON) 200 MG capsule Take by mouth.     Chlorphen-PE-Acetaminophen 4-10-325 MG TABS Take by mouth.     fluticasone (FLONASE) 50 MCG/ACT nasal spray one spray by Both Nostrils route daily.     sertraline (ZOLOFT) 50 MG tablet Take 50 mg by mouth daily.     No current facility-administered medications on file prior to visit.    Review of Systems  Constitutional:  Positive for chills. Negative for fever.  HENT:  Positive for congestion and postnasal drip. Negative for ear pain, sinus pressure, sinus pain and sore throat.   Respiratory:  Positive for cough (productive - yellow - light or dark yellow). Negative for chest tightness, shortness of breath and wheezing.   Cardiovascular:  Positive for chest pain (rib  pain from coughing).  Neurological:  Positive for light-headedness. Negative for dizziness and headaches.       Objective:   Vitals:   05/18/22 1345  BP: 122/76  Pulse: 96  Temp: 98.5 F (36.9 C)  SpO2: 98%   BP Readings from Last 3 Encounters:  05/18/22 122/76  11/26/21 128/76  12/02/20 116/80   Wt Readings from Last 3 Encounters:  05/18/22 222 lb (100.7 kg)  11/26/21 212 lb 4 oz (96.3 kg)  12/02/20 208 lb (94.3 kg)   Body mass index is 44.84 kg/m.    Physical Exam Constitutional:      General: She is not in acute distress.    Appearance: Normal appearance. She is not ill-appearing.  HENT:     Head: Normocephalic and atraumatic.     Right Ear: Tympanic membrane, ear canal and external ear normal.     Left Ear: Tympanic membrane, ear canal and external ear normal.     Mouth/Throat:     Mouth: Mucous membranes are moist.     Pharynx: No oropharyngeal exudate or posterior oropharyngeal erythema.  Eyes:     Conjunctiva/sclera: Conjunctivae normal.  Cardiovascular:     Rate and Rhythm: Normal rate and regular rhythm.  Pulmonary:     Effort: Pulmonary effort is normal. No respiratory distress.     Breath sounds: Normal breath sounds. No wheezing or rales.  Musculoskeletal:        General: Tenderness (Tenderness palpation right anterior and lateral ribs just below bra line-no deformity, swelling in the area) present.     Cervical back: Neck supple. No tenderness.  Lymphadenopathy:     Cervical: No cervical adenopathy.  Skin:    General: Skin is warm and dry.  Neurological:     Mental Status: She is alert.            Assessment & Plan:     Sinus infection: Having significant nasal congestion, postnasal drip and productive cough of yellow sputum Likely related to indwelling sinus infection POCT pregnancy test negative-not currently on any birth control Will prescribe doxycycline 100 mg twice daily x 10 days and prednisone 40 mg daily x 4 days-she may  take 1 and then take the other since she has already had so many medications to treat her infections-hopefully this will wiped out this infection Continue symptomatic treatment  Call if no improvement

## 2022-05-18 ENCOUNTER — Ambulatory Visit: Payer: BC Managed Care – PPO | Admitting: Internal Medicine

## 2022-05-18 ENCOUNTER — Encounter: Payer: Self-pay | Admitting: Internal Medicine

## 2022-05-18 VITALS — BP 122/76 | HR 96 | Temp 98.5°F | Ht 59.0 in | Wt 222.0 lb

## 2022-05-18 DIAGNOSIS — Z3200 Encounter for pregnancy test, result unknown: Secondary | ICD-10-CM

## 2022-05-18 DIAGNOSIS — R197 Diarrhea, unspecified: Secondary | ICD-10-CM | POA: Insufficient documentation

## 2022-05-18 DIAGNOSIS — R933 Abnormal findings on diagnostic imaging of other parts of digestive tract: Secondary | ICD-10-CM | POA: Insufficient documentation

## 2022-05-18 DIAGNOSIS — J0100 Acute maxillary sinusitis, unspecified: Secondary | ICD-10-CM | POA: Diagnosis not present

## 2022-05-18 LAB — POCT URINE PREGNANCY: Preg Test, Ur: NEGATIVE

## 2022-05-18 MED ORDER — PREDNISONE 20 MG PO TABS: 40.0000 mg | ORAL_TABLET | Freq: Every day | ORAL | 0 refills | Status: AC

## 2022-05-18 MED ORDER — DOXYCYCLINE HYCLATE 100 MG PO TABS: 100.0000 mg | ORAL_TABLET | Freq: Two times a day (BID) | ORAL | 0 refills | Status: AC

## 2022-05-18 MED ORDER — DOXYCYCLINE HYCLATE 100 MG PO TABS: 100.0000 mg | ORAL_TABLET | Freq: Two times a day (BID) | ORAL | 0 refills | Status: DC

## 2022-05-18 MED ORDER — PREDNISONE 20 MG PO TABS: 40.0000 mg | ORAL_TABLET | Freq: Every day | ORAL | 0 refills | Status: DC

## 2022-05-18 NOTE — Patient Instructions (Addendum)
         Medications changes include :   prednisone 40 mg daily x 4 days, doxycycline 100 mg twice daily x 10 days      Return if symptoms worsen or fail to improve.

## 2022-05-18 NOTE — Addendum Note (Signed)
Addended by: Basil Dess on: 05/18/2022 04:11 PM   Modules accepted: Orders

## 2022-05-29 DIAGNOSIS — F321 Major depressive disorder, single episode, moderate: Secondary | ICD-10-CM | POA: Diagnosis not present

## 2022-06-01 DIAGNOSIS — Z20828 Contact with and (suspected) exposure to other viral communicable diseases: Secondary | ICD-10-CM | POA: Diagnosis not present

## 2022-06-01 DIAGNOSIS — Z6841 Body Mass Index (BMI) 40.0 and over, adult: Secondary | ICD-10-CM | POA: Diagnosis not present

## 2022-06-01 DIAGNOSIS — J00 Acute nasopharyngitis [common cold]: Secondary | ICD-10-CM | POA: Diagnosis not present

## 2022-06-05 DIAGNOSIS — Z6841 Body Mass Index (BMI) 40.0 and over, adult: Secondary | ICD-10-CM | POA: Diagnosis not present

## 2022-06-05 DIAGNOSIS — R051 Acute cough: Secondary | ICD-10-CM | POA: Diagnosis not present

## 2022-06-05 DIAGNOSIS — H66001 Acute suppurative otitis media without spontaneous rupture of ear drum, right ear: Secondary | ICD-10-CM | POA: Diagnosis not present

## 2022-06-26 DIAGNOSIS — F321 Major depressive disorder, single episode, moderate: Secondary | ICD-10-CM | POA: Diagnosis not present

## 2022-07-10 DIAGNOSIS — F902 Attention-deficit hyperactivity disorder, combined type: Secondary | ICD-10-CM | POA: Diagnosis not present

## 2022-07-10 DIAGNOSIS — F411 Generalized anxiety disorder: Secondary | ICD-10-CM | POA: Diagnosis not present

## 2022-07-21 DIAGNOSIS — F411 Generalized anxiety disorder: Secondary | ICD-10-CM | POA: Diagnosis not present

## 2022-07-21 DIAGNOSIS — F902 Attention-deficit hyperactivity disorder, combined type: Secondary | ICD-10-CM | POA: Diagnosis not present

## 2022-07-24 DIAGNOSIS — F321 Major depressive disorder, single episode, moderate: Secondary | ICD-10-CM | POA: Diagnosis not present

## 2022-08-07 DIAGNOSIS — F902 Attention-deficit hyperactivity disorder, combined type: Secondary | ICD-10-CM | POA: Diagnosis not present

## 2022-08-07 DIAGNOSIS — F411 Generalized anxiety disorder: Secondary | ICD-10-CM | POA: Diagnosis not present

## 2022-08-21 DIAGNOSIS — F902 Attention-deficit hyperactivity disorder, combined type: Secondary | ICD-10-CM | POA: Diagnosis not present

## 2022-08-21 DIAGNOSIS — F321 Major depressive disorder, single episode, moderate: Secondary | ICD-10-CM | POA: Diagnosis not present

## 2022-08-21 DIAGNOSIS — F411 Generalized anxiety disorder: Secondary | ICD-10-CM | POA: Diagnosis not present

## 2022-09-03 DIAGNOSIS — F902 Attention-deficit hyperactivity disorder, combined type: Secondary | ICD-10-CM | POA: Diagnosis not present

## 2022-09-17 DIAGNOSIS — F902 Attention-deficit hyperactivity disorder, combined type: Secondary | ICD-10-CM | POA: Diagnosis not present

## 2022-09-17 DIAGNOSIS — F411 Generalized anxiety disorder: Secondary | ICD-10-CM | POA: Diagnosis not present

## 2022-09-18 DIAGNOSIS — F321 Major depressive disorder, single episode, moderate: Secondary | ICD-10-CM | POA: Diagnosis not present

## 2022-10-14 DIAGNOSIS — F902 Attention-deficit hyperactivity disorder, combined type: Secondary | ICD-10-CM | POA: Diagnosis not present

## 2022-10-14 DIAGNOSIS — F411 Generalized anxiety disorder: Secondary | ICD-10-CM | POA: Diagnosis not present

## 2022-10-16 DIAGNOSIS — F321 Major depressive disorder, single episode, moderate: Secondary | ICD-10-CM | POA: Diagnosis not present

## 2022-10-28 DIAGNOSIS — F902 Attention-deficit hyperactivity disorder, combined type: Secondary | ICD-10-CM | POA: Diagnosis not present

## 2022-10-28 DIAGNOSIS — F411 Generalized anxiety disorder: Secondary | ICD-10-CM | POA: Diagnosis not present

## 2022-11-03 DIAGNOSIS — F321 Major depressive disorder, single episode, moderate: Secondary | ICD-10-CM | POA: Diagnosis not present

## 2022-11-05 DIAGNOSIS — Z6841 Body Mass Index (BMI) 40.0 and over, adult: Secondary | ICD-10-CM | POA: Diagnosis not present

## 2022-11-05 DIAGNOSIS — Z01419 Encounter for gynecological examination (general) (routine) without abnormal findings: Secondary | ICD-10-CM | POA: Diagnosis not present

## 2022-11-13 DIAGNOSIS — F321 Major depressive disorder, single episode, moderate: Secondary | ICD-10-CM | POA: Diagnosis not present

## 2022-11-13 DIAGNOSIS — Z124 Encounter for screening for malignant neoplasm of cervix: Secondary | ICD-10-CM | POA: Diagnosis not present

## 2022-11-27 DIAGNOSIS — F902 Attention-deficit hyperactivity disorder, combined type: Secondary | ICD-10-CM | POA: Diagnosis not present

## 2022-11-27 DIAGNOSIS — F411 Generalized anxiety disorder: Secondary | ICD-10-CM | POA: Diagnosis not present

## 2022-12-24 DIAGNOSIS — F321 Major depressive disorder, single episode, moderate: Secondary | ICD-10-CM | POA: Diagnosis not present

## 2022-12-25 DIAGNOSIS — F411 Generalized anxiety disorder: Secondary | ICD-10-CM | POA: Diagnosis not present

## 2022-12-25 DIAGNOSIS — F902 Attention-deficit hyperactivity disorder, combined type: Secondary | ICD-10-CM | POA: Diagnosis not present

## 2022-12-30 DIAGNOSIS — U071 COVID-19: Secondary | ICD-10-CM | POA: Diagnosis not present

## 2023-01-08 DIAGNOSIS — F321 Major depressive disorder, single episode, moderate: Secondary | ICD-10-CM | POA: Diagnosis not present

## 2023-01-11 DIAGNOSIS — N939 Abnormal uterine and vaginal bleeding, unspecified: Secondary | ICD-10-CM | POA: Diagnosis not present

## 2023-02-17 DIAGNOSIS — F321 Major depressive disorder, single episode, moderate: Secondary | ICD-10-CM | POA: Diagnosis not present

## 2023-03-17 DIAGNOSIS — F321 Major depressive disorder, single episode, moderate: Secondary | ICD-10-CM | POA: Diagnosis not present

## 2023-03-19 DIAGNOSIS — F902 Attention-deficit hyperactivity disorder, combined type: Secondary | ICD-10-CM | POA: Diagnosis not present

## 2023-03-19 DIAGNOSIS — F411 Generalized anxiety disorder: Secondary | ICD-10-CM | POA: Diagnosis not present

## 2023-04-02 DIAGNOSIS — F411 Generalized anxiety disorder: Secondary | ICD-10-CM | POA: Diagnosis not present

## 2023-04-02 DIAGNOSIS — F902 Attention-deficit hyperactivity disorder, combined type: Secondary | ICD-10-CM | POA: Diagnosis not present

## 2023-04-07 DIAGNOSIS — S8251XA Displaced fracture of medial malleolus of right tibia, initial encounter for closed fracture: Secondary | ICD-10-CM | POA: Diagnosis not present

## 2023-04-07 DIAGNOSIS — M25572 Pain in left ankle and joints of left foot: Secondary | ICD-10-CM | POA: Diagnosis not present

## 2023-04-07 DIAGNOSIS — M25571 Pain in right ankle and joints of right foot: Secondary | ICD-10-CM | POA: Diagnosis not present

## 2023-04-12 DIAGNOSIS — S8265XA Nondisplaced fracture of lateral malleolus of left fibula, initial encounter for closed fracture: Secondary | ICD-10-CM | POA: Diagnosis not present

## 2023-04-12 DIAGNOSIS — S93491A Sprain of other ligament of right ankle, initial encounter: Secondary | ICD-10-CM | POA: Diagnosis not present

## 2023-04-14 DIAGNOSIS — F321 Major depressive disorder, single episode, moderate: Secondary | ICD-10-CM | POA: Diagnosis not present

## 2023-04-23 DIAGNOSIS — S8265XA Nondisplaced fracture of lateral malleolus of left fibula, initial encounter for closed fracture: Secondary | ICD-10-CM | POA: Diagnosis not present

## 2023-04-23 DIAGNOSIS — S93491A Sprain of other ligament of right ankle, initial encounter: Secondary | ICD-10-CM | POA: Diagnosis not present

## 2023-04-30 DIAGNOSIS — F902 Attention-deficit hyperactivity disorder, combined type: Secondary | ICD-10-CM | POA: Diagnosis not present

## 2023-04-30 DIAGNOSIS — F411 Generalized anxiety disorder: Secondary | ICD-10-CM | POA: Diagnosis not present

## 2023-05-06 DIAGNOSIS — S8265XD Nondisplaced fracture of lateral malleolus of left fibula, subsequent encounter for closed fracture with routine healing: Secondary | ICD-10-CM | POA: Diagnosis not present

## 2023-05-12 DIAGNOSIS — F321 Major depressive disorder, single episode, moderate: Secondary | ICD-10-CM | POA: Diagnosis not present

## 2023-05-24 DIAGNOSIS — S8262XA Displaced fracture of lateral malleolus of left fibula, initial encounter for closed fracture: Secondary | ICD-10-CM | POA: Diagnosis not present

## 2023-05-24 DIAGNOSIS — S93492A Sprain of other ligament of left ankle, initial encounter: Secondary | ICD-10-CM | POA: Diagnosis not present

## 2023-05-28 DIAGNOSIS — F321 Major depressive disorder, single episode, moderate: Secondary | ICD-10-CM | POA: Diagnosis not present

## 2023-05-31 DIAGNOSIS — F411 Generalized anxiety disorder: Secondary | ICD-10-CM | POA: Diagnosis not present

## 2023-05-31 DIAGNOSIS — F902 Attention-deficit hyperactivity disorder, combined type: Secondary | ICD-10-CM | POA: Diagnosis not present

## 2023-06-10 DIAGNOSIS — Z6841 Body Mass Index (BMI) 40.0 and over, adult: Secondary | ICD-10-CM | POA: Diagnosis not present

## 2023-06-10 DIAGNOSIS — Z9189 Other specified personal risk factors, not elsewhere classified: Secondary | ICD-10-CM | POA: Diagnosis not present

## 2023-06-10 DIAGNOSIS — Z1331 Encounter for screening for depression: Secondary | ICD-10-CM | POA: Diagnosis not present

## 2023-06-10 DIAGNOSIS — E8889 Other specified metabolic disorders: Secondary | ICD-10-CM | POA: Diagnosis not present

## 2023-06-10 DIAGNOSIS — E559 Vitamin D deficiency, unspecified: Secondary | ICD-10-CM | POA: Diagnosis not present

## 2023-06-10 DIAGNOSIS — Z131 Encounter for screening for diabetes mellitus: Secondary | ICD-10-CM | POA: Diagnosis not present

## 2023-06-10 DIAGNOSIS — Z862 Personal history of diseases of the blood and blood-forming organs and certain disorders involving the immune mechanism: Secondary | ICD-10-CM | POA: Diagnosis not present

## 2023-06-10 DIAGNOSIS — E782 Mixed hyperlipidemia: Secondary | ICD-10-CM | POA: Diagnosis not present

## 2023-06-10 DIAGNOSIS — E88819 Insulin resistance, unspecified: Secondary | ICD-10-CM | POA: Diagnosis not present

## 2023-06-11 DIAGNOSIS — F321 Major depressive disorder, single episode, moderate: Secondary | ICD-10-CM | POA: Diagnosis not present

## 2023-06-14 ENCOUNTER — Encounter: Payer: BC Managed Care – PPO | Admitting: Internal Medicine

## 2023-06-21 DIAGNOSIS — L821 Other seborrheic keratosis: Secondary | ICD-10-CM | POA: Diagnosis not present

## 2023-06-21 DIAGNOSIS — R208 Other disturbances of skin sensation: Secondary | ICD-10-CM | POA: Diagnosis not present

## 2023-06-21 DIAGNOSIS — Z789 Other specified health status: Secondary | ICD-10-CM | POA: Diagnosis not present

## 2023-06-21 DIAGNOSIS — L700 Acne vulgaris: Secondary | ICD-10-CM | POA: Diagnosis not present

## 2023-06-21 DIAGNOSIS — L820 Inflamed seborrheic keratosis: Secondary | ICD-10-CM | POA: Diagnosis not present

## 2023-06-21 DIAGNOSIS — R262 Difficulty in walking, not elsewhere classified: Secondary | ICD-10-CM | POA: Diagnosis not present

## 2023-06-21 DIAGNOSIS — M25572 Pain in left ankle and joints of left foot: Secondary | ICD-10-CM | POA: Diagnosis not present

## 2023-06-21 DIAGNOSIS — D1801 Hemangioma of skin and subcutaneous tissue: Secondary | ICD-10-CM | POA: Diagnosis not present

## 2023-06-21 DIAGNOSIS — M25571 Pain in right ankle and joints of right foot: Secondary | ICD-10-CM | POA: Diagnosis not present

## 2023-06-21 DIAGNOSIS — L538 Other specified erythematous conditions: Secondary | ICD-10-CM | POA: Diagnosis not present

## 2023-06-21 DIAGNOSIS — M6281 Muscle weakness (generalized): Secondary | ICD-10-CM | POA: Diagnosis not present

## 2023-06-21 DIAGNOSIS — L814 Other melanin hyperpigmentation: Secondary | ICD-10-CM | POA: Diagnosis not present

## 2023-06-23 DIAGNOSIS — R262 Difficulty in walking, not elsewhere classified: Secondary | ICD-10-CM | POA: Diagnosis not present

## 2023-06-23 DIAGNOSIS — Z6841 Body Mass Index (BMI) 40.0 and over, adult: Secondary | ICD-10-CM | POA: Diagnosis not present

## 2023-06-23 DIAGNOSIS — M25571 Pain in right ankle and joints of right foot: Secondary | ICD-10-CM | POA: Diagnosis not present

## 2023-06-23 DIAGNOSIS — M25572 Pain in left ankle and joints of left foot: Secondary | ICD-10-CM | POA: Diagnosis not present

## 2023-06-23 DIAGNOSIS — Z79899 Other long term (current) drug therapy: Secondary | ICD-10-CM | POA: Diagnosis not present

## 2023-06-23 DIAGNOSIS — E88819 Insulin resistance, unspecified: Secondary | ICD-10-CM | POA: Diagnosis not present

## 2023-06-23 DIAGNOSIS — E782 Mixed hyperlipidemia: Secondary | ICD-10-CM | POA: Diagnosis not present

## 2023-06-23 DIAGNOSIS — E559 Vitamin D deficiency, unspecified: Secondary | ICD-10-CM | POA: Diagnosis not present

## 2023-06-23 DIAGNOSIS — M6281 Muscle weakness (generalized): Secondary | ICD-10-CM | POA: Diagnosis not present

## 2023-06-25 DIAGNOSIS — F321 Major depressive disorder, single episode, moderate: Secondary | ICD-10-CM | POA: Diagnosis not present

## 2023-06-26 DIAGNOSIS — H60332 Swimmer's ear, left ear: Secondary | ICD-10-CM | POA: Diagnosis not present

## 2023-06-28 DIAGNOSIS — M25572 Pain in left ankle and joints of left foot: Secondary | ICD-10-CM | POA: Diagnosis not present

## 2023-06-28 DIAGNOSIS — M25571 Pain in right ankle and joints of right foot: Secondary | ICD-10-CM | POA: Diagnosis not present

## 2023-06-28 DIAGNOSIS — M6281 Muscle weakness (generalized): Secondary | ICD-10-CM | POA: Diagnosis not present

## 2023-06-28 DIAGNOSIS — R262 Difficulty in walking, not elsewhere classified: Secondary | ICD-10-CM | POA: Diagnosis not present

## 2023-06-28 DIAGNOSIS — S8265XA Nondisplaced fracture of lateral malleolus of left fibula, initial encounter for closed fracture: Secondary | ICD-10-CM | POA: Diagnosis not present

## 2023-06-29 DIAGNOSIS — R0681 Apnea, not elsewhere classified: Secondary | ICD-10-CM | POA: Diagnosis not present

## 2023-06-29 DIAGNOSIS — E88819 Insulin resistance, unspecified: Secondary | ICD-10-CM | POA: Diagnosis not present

## 2023-06-29 DIAGNOSIS — G4719 Other hypersomnia: Secondary | ICD-10-CM | POA: Diagnosis not present

## 2023-06-29 DIAGNOSIS — R0683 Snoring: Secondary | ICD-10-CM | POA: Diagnosis not present

## 2023-07-01 DIAGNOSIS — M25572 Pain in left ankle and joints of left foot: Secondary | ICD-10-CM | POA: Diagnosis not present

## 2023-07-01 DIAGNOSIS — R262 Difficulty in walking, not elsewhere classified: Secondary | ICD-10-CM | POA: Diagnosis not present

## 2023-07-01 DIAGNOSIS — M25571 Pain in right ankle and joints of right foot: Secondary | ICD-10-CM | POA: Diagnosis not present

## 2023-07-01 DIAGNOSIS — M6281 Muscle weakness (generalized): Secondary | ICD-10-CM | POA: Diagnosis not present

## 2023-07-02 ENCOUNTER — Encounter: Payer: Self-pay | Admitting: Internal Medicine

## 2023-07-02 ENCOUNTER — Ambulatory Visit: Payer: BC Managed Care – PPO | Admitting: Internal Medicine

## 2023-07-02 VITALS — BP 112/80 | HR 78 | Temp 98.6°F | Ht 59.0 in | Wt 219.0 lb

## 2023-07-02 DIAGNOSIS — Z0000 Encounter for general adult medical examination without abnormal findings: Secondary | ICD-10-CM | POA: Diagnosis not present

## 2023-07-02 DIAGNOSIS — Z6841 Body Mass Index (BMI) 40.0 and over, adult: Secondary | ICD-10-CM | POA: Diagnosis not present

## 2023-07-02 MED ORDER — FLUCONAZOLE 150 MG PO TABS: 150.0000 mg | ORAL_TABLET | Freq: Once | ORAL | 0 refills | Status: AC

## 2023-07-02 NOTE — Assessment & Plan Note (Signed)
 Just started zepbound 1 month ago and doing well without side effects. Counseled about impact likely to fertility.

## 2023-07-02 NOTE — Progress Notes (Signed)
   Subjective:   Patient ID: Donna Spencer, female    DOB: 1985/10/23, 38 y.o.   MRN: [id number]  HPI The patient is here for physical.  PMH, [facility], social history reviewed and updated  Review of Systems  Constitutional: Negative.   HENT: Negative.    Eyes: Negative.   Respiratory:  Negative for cough, chest tightness and shortness of breath.   Cardiovascular:  Negative for chest pain, palpitations and leg swelling.  Gastrointestinal:  Negative for abdominal distention, abdominal pain, constipation, diarrhea, nausea and vomiting.  Musculoskeletal: Negative.   Skin: Negative.   Neurological: Negative.   Psychiatric/Behavioral: Negative.      Objective:  Physical Exam Constitutional:      Appearance: She is well-developed.  HENT:     Head: Normocephalic and atraumatic.  Cardiovascular:     Rate and Rhythm: Normal rate and regular rhythm.  Pulmonary:     Effort: Pulmonary effort is normal. No respiratory distress.     Breath sounds: Normal breath sounds. No wheezing or rales.  Abdominal:     General: Bowel sounds are normal. There is no distension.     Palpations: Abdomen is soft.     Tenderness: There is no abdominal tenderness. There is no rebound.  Musculoskeletal:     Cervical back: Normal range of motion.  Skin:    General: Skin is warm and dry.  Neurological:     Mental Status: She is alert and oriented to person, place, and time.     Coordination: Coordination normal.     Vitals:   07/02/23 0829  BP: 112/80  Pulse: 78  Temp: 98.6 F (37 C)  TempSrc: Oral  SpO2: 95%  Weight: 219 lb (99.3 kg)  Height: 4\' 11"  (1.499 m)    Assessment & Plan:

## 2023-07-02 NOTE — Assessment & Plan Note (Signed)
 Flu shot counseled. Tetanus up to date. Mammogram up to date, pap smear up to date. Counseled about sun safety and mole surveillance. Counseled about the dangers of distracted driving. Given 10 year screening recommendations.

## 2023-07-05 DIAGNOSIS — M25571 Pain in right ankle and joints of right foot: Secondary | ICD-10-CM | POA: Diagnosis not present

## 2023-07-05 DIAGNOSIS — M6281 Muscle weakness (generalized): Secondary | ICD-10-CM | POA: Diagnosis not present

## 2023-07-05 DIAGNOSIS — M25572 Pain in left ankle and joints of left foot: Secondary | ICD-10-CM | POA: Diagnosis not present

## 2023-07-05 DIAGNOSIS — R262 Difficulty in walking, not elsewhere classified: Secondary | ICD-10-CM | POA: Diagnosis not present

## 2023-07-07 DIAGNOSIS — E88819 Insulin resistance, unspecified: Secondary | ICD-10-CM | POA: Diagnosis not present

## 2023-07-07 DIAGNOSIS — E559 Vitamin D deficiency, unspecified: Secondary | ICD-10-CM | POA: Diagnosis not present

## 2023-07-07 DIAGNOSIS — Z79899 Other long term (current) drug therapy: Secondary | ICD-10-CM | POA: Diagnosis not present

## 2023-07-07 DIAGNOSIS — F909 Attention-deficit hyperactivity disorder, unspecified type: Secondary | ICD-10-CM | POA: Diagnosis not present

## 2023-07-07 DIAGNOSIS — Z6841 Body Mass Index (BMI) 40.0 and over, adult: Secondary | ICD-10-CM | POA: Diagnosis not present

## 2023-07-07 DIAGNOSIS — E782 Mixed hyperlipidemia: Secondary | ICD-10-CM | POA: Diagnosis not present

## 2023-07-09 DIAGNOSIS — M25571 Pain in right ankle and joints of right foot: Secondary | ICD-10-CM | POA: Diagnosis not present

## 2023-07-09 DIAGNOSIS — R262 Difficulty in walking, not elsewhere classified: Secondary | ICD-10-CM | POA: Diagnosis not present

## 2023-07-09 DIAGNOSIS — M25572 Pain in left ankle and joints of left foot: Secondary | ICD-10-CM | POA: Diagnosis not present

## 2023-07-09 DIAGNOSIS — M6281 Muscle weakness (generalized): Secondary | ICD-10-CM | POA: Diagnosis not present

## 2023-07-12 DIAGNOSIS — M6281 Muscle weakness (generalized): Secondary | ICD-10-CM | POA: Diagnosis not present

## 2023-07-12 DIAGNOSIS — R262 Difficulty in walking, not elsewhere classified: Secondary | ICD-10-CM | POA: Diagnosis not present

## 2023-07-12 DIAGNOSIS — M25572 Pain in left ankle and joints of left foot: Secondary | ICD-10-CM | POA: Diagnosis not present

## 2023-07-12 DIAGNOSIS — M25571 Pain in right ankle and joints of right foot: Secondary | ICD-10-CM | POA: Diagnosis not present

## 2023-07-16 DIAGNOSIS — M6281 Muscle weakness (generalized): Secondary | ICD-10-CM | POA: Diagnosis not present

## 2023-07-16 DIAGNOSIS — M25572 Pain in left ankle and joints of left foot: Secondary | ICD-10-CM | POA: Diagnosis not present

## 2023-07-16 DIAGNOSIS — R262 Difficulty in walking, not elsewhere classified: Secondary | ICD-10-CM | POA: Diagnosis not present

## 2023-07-16 DIAGNOSIS — M25571 Pain in right ankle and joints of right foot: Secondary | ICD-10-CM | POA: Diagnosis not present

## 2023-07-19 DIAGNOSIS — G4733 Obstructive sleep apnea (adult) (pediatric): Secondary | ICD-10-CM | POA: Diagnosis not present

## 2023-07-19 DIAGNOSIS — M25572 Pain in left ankle and joints of left foot: Secondary | ICD-10-CM | POA: Diagnosis not present

## 2023-07-19 DIAGNOSIS — M25571 Pain in right ankle and joints of right foot: Secondary | ICD-10-CM | POA: Diagnosis not present

## 2023-07-19 DIAGNOSIS — M6281 Muscle weakness (generalized): Secondary | ICD-10-CM | POA: Diagnosis not present

## 2023-07-19 DIAGNOSIS — R262 Difficulty in walking, not elsewhere classified: Secondary | ICD-10-CM | POA: Diagnosis not present

## 2023-07-21 DIAGNOSIS — Z9189 Other specified personal risk factors, not elsewhere classified: Secondary | ICD-10-CM | POA: Diagnosis not present

## 2023-07-21 DIAGNOSIS — E782 Mixed hyperlipidemia: Secondary | ICD-10-CM | POA: Diagnosis not present

## 2023-07-21 DIAGNOSIS — Z6841 Body Mass Index (BMI) 40.0 and over, adult: Secondary | ICD-10-CM | POA: Diagnosis not present

## 2023-07-21 DIAGNOSIS — R197 Diarrhea, unspecified: Secondary | ICD-10-CM | POA: Diagnosis not present

## 2023-07-21 DIAGNOSIS — E88819 Insulin resistance, unspecified: Secondary | ICD-10-CM | POA: Diagnosis not present

## 2023-07-21 DIAGNOSIS — E559 Vitamin D deficiency, unspecified: Secondary | ICD-10-CM | POA: Diagnosis not present

## 2023-07-22 DIAGNOSIS — F909 Attention-deficit hyperactivity disorder, unspecified type: Secondary | ICD-10-CM | POA: Diagnosis not present

## 2023-07-22 DIAGNOSIS — G4733 Obstructive sleep apnea (adult) (pediatric): Secondary | ICD-10-CM | POA: Diagnosis not present

## 2023-07-22 DIAGNOSIS — F418 Other specified anxiety disorders: Secondary | ICD-10-CM | POA: Diagnosis not present

## 2023-07-23 DIAGNOSIS — M25572 Pain in left ankle and joints of left foot: Secondary | ICD-10-CM | POA: Diagnosis not present

## 2023-07-23 DIAGNOSIS — M25571 Pain in right ankle and joints of right foot: Secondary | ICD-10-CM | POA: Diagnosis not present

## 2023-07-23 DIAGNOSIS — M6281 Muscle weakness (generalized): Secondary | ICD-10-CM | POA: Diagnosis not present

## 2023-07-23 DIAGNOSIS — R262 Difficulty in walking, not elsewhere classified: Secondary | ICD-10-CM | POA: Diagnosis not present

## 2023-07-23 DIAGNOSIS — F321 Major depressive disorder, single episode, moderate: Secondary | ICD-10-CM | POA: Diagnosis not present

## 2023-07-26 DIAGNOSIS — M25571 Pain in right ankle and joints of right foot: Secondary | ICD-10-CM | POA: Diagnosis not present

## 2023-07-26 DIAGNOSIS — R262 Difficulty in walking, not elsewhere classified: Secondary | ICD-10-CM | POA: Diagnosis not present

## 2023-07-26 DIAGNOSIS — M25572 Pain in left ankle and joints of left foot: Secondary | ICD-10-CM | POA: Diagnosis not present

## 2023-07-26 DIAGNOSIS — M6281 Muscle weakness (generalized): Secondary | ICD-10-CM | POA: Diagnosis not present

## 2023-07-29 DIAGNOSIS — F411 Generalized anxiety disorder: Secondary | ICD-10-CM | POA: Diagnosis not present

## 2023-07-29 DIAGNOSIS — F902 Attention-deficit hyperactivity disorder, combined type: Secondary | ICD-10-CM | POA: Diagnosis not present

## 2023-07-30 DIAGNOSIS — G4733 Obstructive sleep apnea (adult) (pediatric): Secondary | ICD-10-CM | POA: Diagnosis not present

## 2023-07-30 DIAGNOSIS — S8265XA Nondisplaced fracture of lateral malleolus of left fibula, initial encounter for closed fracture: Secondary | ICD-10-CM | POA: Diagnosis not present

## 2023-08-02 DIAGNOSIS — M6281 Muscle weakness (generalized): Secondary | ICD-10-CM | POA: Diagnosis not present

## 2023-08-02 DIAGNOSIS — M25572 Pain in left ankle and joints of left foot: Secondary | ICD-10-CM | POA: Diagnosis not present

## 2023-08-02 DIAGNOSIS — R262 Difficulty in walking, not elsewhere classified: Secondary | ICD-10-CM | POA: Diagnosis not present

## 2023-08-02 DIAGNOSIS — M25571 Pain in right ankle and joints of right foot: Secondary | ICD-10-CM | POA: Diagnosis not present

## 2023-08-04 DIAGNOSIS — E559 Vitamin D deficiency, unspecified: Secondary | ICD-10-CM | POA: Diagnosis not present

## 2023-08-04 DIAGNOSIS — R197 Diarrhea, unspecified: Secondary | ICD-10-CM | POA: Diagnosis not present

## 2023-08-04 DIAGNOSIS — E782 Mixed hyperlipidemia: Secondary | ICD-10-CM | POA: Diagnosis not present

## 2023-08-04 DIAGNOSIS — E88819 Insulin resistance, unspecified: Secondary | ICD-10-CM | POA: Diagnosis not present

## 2023-08-06 DIAGNOSIS — M25571 Pain in right ankle and joints of right foot: Secondary | ICD-10-CM | POA: Diagnosis not present

## 2023-08-06 DIAGNOSIS — M6281 Muscle weakness (generalized): Secondary | ICD-10-CM | POA: Diagnosis not present

## 2023-08-06 DIAGNOSIS — R262 Difficulty in walking, not elsewhere classified: Secondary | ICD-10-CM | POA: Diagnosis not present

## 2023-08-06 DIAGNOSIS — F321 Major depressive disorder, single episode, moderate: Secondary | ICD-10-CM | POA: Diagnosis not present

## 2023-08-06 DIAGNOSIS — M25572 Pain in left ankle and joints of left foot: Secondary | ICD-10-CM | POA: Diagnosis not present

## 2023-08-16 DIAGNOSIS — M6281 Muscle weakness (generalized): Secondary | ICD-10-CM | POA: Diagnosis not present

## 2023-08-16 DIAGNOSIS — R262 Difficulty in walking, not elsewhere classified: Secondary | ICD-10-CM | POA: Diagnosis not present

## 2023-08-16 DIAGNOSIS — M25571 Pain in right ankle and joints of right foot: Secondary | ICD-10-CM | POA: Diagnosis not present

## 2023-08-16 DIAGNOSIS — M25572 Pain in left ankle and joints of left foot: Secondary | ICD-10-CM | POA: Diagnosis not present

## 2023-08-19 DIAGNOSIS — M25571 Pain in right ankle and joints of right foot: Secondary | ICD-10-CM | POA: Diagnosis not present

## 2023-08-19 DIAGNOSIS — R262 Difficulty in walking, not elsewhere classified: Secondary | ICD-10-CM | POA: Diagnosis not present

## 2023-08-19 DIAGNOSIS — M25572 Pain in left ankle and joints of left foot: Secondary | ICD-10-CM | POA: Diagnosis not present

## 2023-08-19 DIAGNOSIS — M6281 Muscle weakness (generalized): Secondary | ICD-10-CM | POA: Diagnosis not present

## 2023-08-30 DIAGNOSIS — R262 Difficulty in walking, not elsewhere classified: Secondary | ICD-10-CM | POA: Diagnosis not present

## 2023-08-30 DIAGNOSIS — M25572 Pain in left ankle and joints of left foot: Secondary | ICD-10-CM | POA: Diagnosis not present

## 2023-08-30 DIAGNOSIS — M6281 Muscle weakness (generalized): Secondary | ICD-10-CM | POA: Diagnosis not present

## 2023-08-30 DIAGNOSIS — M25571 Pain in right ankle and joints of right foot: Secondary | ICD-10-CM | POA: Diagnosis not present

## 2023-09-01 DIAGNOSIS — R262 Difficulty in walking, not elsewhere classified: Secondary | ICD-10-CM | POA: Diagnosis not present

## 2023-09-01 DIAGNOSIS — E559 Vitamin D deficiency, unspecified: Secondary | ICD-10-CM | POA: Diagnosis not present

## 2023-09-01 DIAGNOSIS — M25572 Pain in left ankle and joints of left foot: Secondary | ICD-10-CM | POA: Diagnosis not present

## 2023-09-01 DIAGNOSIS — E782 Mixed hyperlipidemia: Secondary | ICD-10-CM | POA: Diagnosis not present

## 2023-09-01 DIAGNOSIS — M25571 Pain in right ankle and joints of right foot: Secondary | ICD-10-CM | POA: Diagnosis not present

## 2023-09-01 DIAGNOSIS — E88819 Insulin resistance, unspecified: Secondary | ICD-10-CM | POA: Diagnosis not present

## 2023-09-01 DIAGNOSIS — M6281 Muscle weakness (generalized): Secondary | ICD-10-CM | POA: Diagnosis not present

## 2023-09-01 DIAGNOSIS — R197 Diarrhea, unspecified: Secondary | ICD-10-CM | POA: Diagnosis not present

## 2023-09-08 DIAGNOSIS — M25571 Pain in right ankle and joints of right foot: Secondary | ICD-10-CM | POA: Diagnosis not present

## 2023-09-08 DIAGNOSIS — M6281 Muscle weakness (generalized): Secondary | ICD-10-CM | POA: Diagnosis not present

## 2023-09-08 DIAGNOSIS — M25572 Pain in left ankle and joints of left foot: Secondary | ICD-10-CM | POA: Diagnosis not present

## 2023-09-08 DIAGNOSIS — R262 Difficulty in walking, not elsewhere classified: Secondary | ICD-10-CM | POA: Diagnosis not present

## 2023-09-13 DIAGNOSIS — M25572 Pain in left ankle and joints of left foot: Secondary | ICD-10-CM | POA: Diagnosis not present

## 2023-09-15 DIAGNOSIS — M25372 Other instability, left ankle: Secondary | ICD-10-CM | POA: Diagnosis not present

## 2023-09-16 DIAGNOSIS — G4733 Obstructive sleep apnea (adult) (pediatric): Secondary | ICD-10-CM | POA: Diagnosis not present

## 2023-09-17 DIAGNOSIS — F321 Major depressive disorder, single episode, moderate: Secondary | ICD-10-CM | POA: Diagnosis not present

## 2023-09-21 DIAGNOSIS — M6281 Muscle weakness (generalized): Secondary | ICD-10-CM | POA: Diagnosis not present

## 2023-09-21 DIAGNOSIS — M25572 Pain in left ankle and joints of left foot: Secondary | ICD-10-CM | POA: Diagnosis not present

## 2023-09-21 DIAGNOSIS — R262 Difficulty in walking, not elsewhere classified: Secondary | ICD-10-CM | POA: Diagnosis not present

## 2023-09-21 DIAGNOSIS — M25571 Pain in right ankle and joints of right foot: Secondary | ICD-10-CM | POA: Diagnosis not present

## 2023-09-22 DIAGNOSIS — S8262XK Displaced fracture of lateral malleolus of left fibula, subsequent encounter for closed fracture with nonunion: Secondary | ICD-10-CM | POA: Diagnosis not present

## 2023-09-23 DIAGNOSIS — M25572 Pain in left ankle and joints of left foot: Secondary | ICD-10-CM | POA: Diagnosis not present

## 2023-09-23 DIAGNOSIS — M25571 Pain in right ankle and joints of right foot: Secondary | ICD-10-CM | POA: Diagnosis not present

## 2023-09-23 DIAGNOSIS — R262 Difficulty in walking, not elsewhere classified: Secondary | ICD-10-CM | POA: Diagnosis not present

## 2023-09-23 DIAGNOSIS — M6281 Muscle weakness (generalized): Secondary | ICD-10-CM | POA: Diagnosis not present

## 2023-09-29 DIAGNOSIS — E782 Mixed hyperlipidemia: Secondary | ICD-10-CM | POA: Diagnosis not present

## 2023-09-29 DIAGNOSIS — E88819 Insulin resistance, unspecified: Secondary | ICD-10-CM | POA: Diagnosis not present

## 2023-09-29 DIAGNOSIS — R197 Diarrhea, unspecified: Secondary | ICD-10-CM | POA: Diagnosis not present

## 2023-09-29 DIAGNOSIS — E559 Vitamin D deficiency, unspecified: Secondary | ICD-10-CM | POA: Diagnosis not present

## 2023-10-01 DIAGNOSIS — M6281 Muscle weakness (generalized): Secondary | ICD-10-CM | POA: Diagnosis not present

## 2023-10-01 DIAGNOSIS — R262 Difficulty in walking, not elsewhere classified: Secondary | ICD-10-CM | POA: Diagnosis not present

## 2023-10-01 DIAGNOSIS — M25572 Pain in left ankle and joints of left foot: Secondary | ICD-10-CM | POA: Diagnosis not present

## 2023-10-01 DIAGNOSIS — S8262XK Displaced fracture of lateral malleolus of left fibula, subsequent encounter for closed fracture with nonunion: Secondary | ICD-10-CM | POA: Diagnosis not present

## 2023-10-01 DIAGNOSIS — G4733 Obstructive sleep apnea (adult) (pediatric): Secondary | ICD-10-CM | POA: Diagnosis not present

## 2023-10-01 DIAGNOSIS — M25571 Pain in right ankle and joints of right foot: Secondary | ICD-10-CM | POA: Diagnosis not present

## 2023-10-06 DIAGNOSIS — M25572 Pain in left ankle and joints of left foot: Secondary | ICD-10-CM | POA: Diagnosis not present

## 2023-10-06 DIAGNOSIS — M25571 Pain in right ankle and joints of right foot: Secondary | ICD-10-CM | POA: Diagnosis not present

## 2023-10-06 DIAGNOSIS — R262 Difficulty in walking, not elsewhere classified: Secondary | ICD-10-CM | POA: Diagnosis not present

## 2023-10-06 DIAGNOSIS — M6281 Muscle weakness (generalized): Secondary | ICD-10-CM | POA: Diagnosis not present

## 2023-10-15 DIAGNOSIS — F321 Major depressive disorder, single episode, moderate: Secondary | ICD-10-CM | POA: Diagnosis not present

## 2023-10-20 DIAGNOSIS — M25571 Pain in right ankle and joints of right foot: Secondary | ICD-10-CM | POA: Diagnosis not present

## 2023-10-20 DIAGNOSIS — M25572 Pain in left ankle and joints of left foot: Secondary | ICD-10-CM | POA: Diagnosis not present

## 2023-10-20 DIAGNOSIS — R262 Difficulty in walking, not elsewhere classified: Secondary | ICD-10-CM | POA: Diagnosis not present

## 2023-10-20 DIAGNOSIS — M6281 Muscle weakness (generalized): Secondary | ICD-10-CM | POA: Diagnosis not present

## 2023-10-26 DIAGNOSIS — M6281 Muscle weakness (generalized): Secondary | ICD-10-CM | POA: Diagnosis not present

## 2023-10-26 DIAGNOSIS — M25571 Pain in right ankle and joints of right foot: Secondary | ICD-10-CM | POA: Diagnosis not present

## 2023-10-26 DIAGNOSIS — R262 Difficulty in walking, not elsewhere classified: Secondary | ICD-10-CM | POA: Diagnosis not present

## 2023-10-26 DIAGNOSIS — M25572 Pain in left ankle and joints of left foot: Secondary | ICD-10-CM | POA: Diagnosis not present

## 2023-10-27 DIAGNOSIS — F902 Attention-deficit hyperactivity disorder, combined type: Secondary | ICD-10-CM | POA: Diagnosis not present

## 2023-10-27 DIAGNOSIS — F411 Generalized anxiety disorder: Secondary | ICD-10-CM | POA: Diagnosis not present

## 2023-10-28 DIAGNOSIS — G4733 Obstructive sleep apnea (adult) (pediatric): Secondary | ICD-10-CM | POA: Diagnosis not present

## 2023-10-29 DIAGNOSIS — F321 Major depressive disorder, single episode, moderate: Secondary | ICD-10-CM | POA: Diagnosis not present

## 2023-11-09 DIAGNOSIS — M25572 Pain in left ankle and joints of left foot: Secondary | ICD-10-CM | POA: Diagnosis not present

## 2023-11-09 DIAGNOSIS — M25571 Pain in right ankle and joints of right foot: Secondary | ICD-10-CM | POA: Diagnosis not present

## 2023-11-09 DIAGNOSIS — R262 Difficulty in walking, not elsewhere classified: Secondary | ICD-10-CM | POA: Diagnosis not present

## 2023-11-09 DIAGNOSIS — M6281 Muscle weakness (generalized): Secondary | ICD-10-CM | POA: Diagnosis not present

## 2023-11-10 DIAGNOSIS — S8262XK Displaced fracture of lateral malleolus of left fibula, subsequent encounter for closed fracture with nonunion: Secondary | ICD-10-CM | POA: Diagnosis not present

## 2023-11-12 DIAGNOSIS — F321 Major depressive disorder, single episode, moderate: Secondary | ICD-10-CM | POA: Diagnosis not present

## 2023-11-26 DIAGNOSIS — F902 Attention-deficit hyperactivity disorder, combined type: Secondary | ICD-10-CM | POA: Diagnosis not present

## 2023-11-26 DIAGNOSIS — F411 Generalized anxiety disorder: Secondary | ICD-10-CM | POA: Diagnosis not present

## 2023-12-09 ENCOUNTER — Ambulatory Visit (INDEPENDENT_AMBULATORY_CARE_PROVIDER_SITE_OTHER): Admitting: Otolaryngology

## 2023-12-09 ENCOUNTER — Encounter (INDEPENDENT_AMBULATORY_CARE_PROVIDER_SITE_OTHER): Payer: Self-pay | Admitting: Otolaryngology

## 2023-12-09 VITALS — BP 112/77 | HR 78 | Ht 59.0 in | Wt 210.0 lb

## 2023-12-09 DIAGNOSIS — J343 Hypertrophy of nasal turbinates: Secondary | ICD-10-CM

## 2023-12-09 DIAGNOSIS — J342 Deviated nasal septum: Secondary | ICD-10-CM | POA: Diagnosis not present

## 2023-12-09 DIAGNOSIS — R0981 Nasal congestion: Secondary | ICD-10-CM

## 2023-12-09 DIAGNOSIS — J3489 Other specified disorders of nose and nasal sinuses: Secondary | ICD-10-CM

## 2023-12-09 DIAGNOSIS — G4733 Obstructive sleep apnea (adult) (pediatric): Secondary | ICD-10-CM

## 2023-12-09 NOTE — Progress Notes (Signed)
 Otolaryngology Clinic Note  HISTORY: Donna Spencer is a 38 y.o. female for nasal airway obstruction  Initial visit (11/2023): She was diagnosed with a deviated septum many years ago by Dr. Thaddeus. She reports that she has been mouth breathing, constant, never breathed normal through the nose. Switches side but right worse than left. Denies nasal trauma.  She does report some intermittent sinus infections - more so in the winter; 2-4 per year. Symptoms include facial pressure (frontal, max) bilaterally, discolored drainage (yellow), A/P rhinorrhea. Typically gets bronchitis in winter. Typically gets antibiotics, and it helps sometimes. Reports sense of smell is bad. Between episodes, does not have CRS cardinal symptoms.  She has tried breathe right strips, prior saline rinses use, flonase . Did not help.   Mild AR symptoms in spring.  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 No previous sinonasal surgery.  GLP-1: yes AP/AC: no  Tobacco: no  PMHx: MDD, ADHD, HLD  RADIOGRAPHIC EVALUATION AND INDEPENDENT REVIEW OF OTHER RECORDS:: Marinell Sayers Referral notes reviewed and uploaded or available in chart in media tab (09/16/2023): noted sleep apnea and with mild sleep apnea; difficulty tolerating; Dx: OSA, AHI 7.6, Nadir 84%, proceed with oral appliance; Nasal airway abnormality; Rx: ref to ENT Labs reviewed 11/26/2021: CBC and CMP: BUN/Cr 13/0.67; Eos 200 Sleep study reviewed and interpreted (06/2023) in media tab: AHI 7.6; mild OSA  Past Medical History:  Diagnosis Date   Allergy    Anal fissure    Anemia    Anxiety    Arthritis    Asthma    Benign hematuria    GERD (gastroesophageal reflux disease)    Mouth ulcers    recurrent, apthous   Past Surgical History:  Procedure Laterality Date   CESAREAN SECTION N/A 06/26/2020   Procedure: CESAREAN SECTION;  Surgeon: Lequita Evalene LABOR, MD;  Location: [facility];  Service: Obstetrics;  Laterality: N/A;   REPAIR  ANKLE LIGAMENT Right 2002   Family History  Problem Relation Age of Onset   Hypertension Mother    Cancer Mother    Obesity Mother    Cancer Maternal Grandmother    Breast cancer Maternal Grandmother    Hypertension Maternal Grandmother    GER disease Paternal Grandmother    Stroke Paternal Grandmother    COPD Paternal Grandfather    Hearing loss Paternal Grandfather    Heart disease Paternal Grandfather    Anxiety disorder Father    Depression Father    Anxiety disorder Sister    Diabetes Paternal Uncle    Drug abuse Maternal Uncle    Social History   Tobacco Use   Smoking status: Never   Smokeless tobacco: Never  Substance Use Topics   Alcohol use: No    Comment: occasionally - couple times/month (liquor)   Allergies  Allergen Reactions   Biaxin [Clarithromycin] Other (See Comments)    Reaction unknown to patient, 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  Semaglutide, 1 MG/DOSE, (OZEMPIC, 1 MG/DOSE,) 4 MG/3ML SOPN      amoxicillin  (AMOXIL ) 875 MG tablet Take 875 mg by mouth 2 (two) times daily. (Patient not taking: Reported on 12/09/2023)     cholecalciferol (VITAMIN D3) 25 MCG (1000 UNIT) tablet  (Patient not taking: Reported on 12/09/2023)     ofloxacin (OCUFLOX) 0.3 % ophthalmic solution INSTILL 2 DROPS INTO AFFECTED EYE 4 TIMES DAILY (Patient not taking: Reported on 12/09/2023)     pantoprazole (PROTONIX) 40 MG tablet Take 40 mg by mouth every morning. (Patient not taking: Reported on 12/09/2023)     tirzepatide  (ZEPBOUND) 5 MG/0.5ML injection vial inject 5 mg Subcutaneous once a week Please send 5 mg single use vials (Patient not taking: Reported on 12/09/2023)     No current facility-administered medications for this visit.   BP 112/77 (BP Location: Left Arm, Patient Position: Sitting, Cuff Size: Large)   Pulse 78   Ht 4' 11 (1.499 m)   Wt 210 lb (95.3 kg)   SpO2 95%   BMI 42.41 kg/m   PHYSICAL EXAM:  BP 112/77 (BP Location: Left Arm, Patient Position: Sitting, Cuff Size: Large)   Pulse 78   Ht 4' 11 (1.499 m)   Wt 210 lb (95.3 kg)   SpO2 95%   BMI 42.41 kg/m    Salient findings:  CN II-XII intact Bilateral EAC clear and TM intact with well pneumatized middle ear spaces Nose: Anterior rhinoscopy reveals modest septal deviation right with bilateral inferior turbinate hypertrophy.  Nasal endoscopy was indicated to better evaluate the nose and paranasal sinuses, given the patient's history and exam findings, and is detailed below. No lesions of oral cavity/oropharynx No obviously palpable neck masses/lymphadenopathy/thyromegaly No respiratory distress or stridor   PROCEDURE:  Prior to initiating any procedures, risks/benefits/alternatives were explained to the patient and verbal consent obtained. Diagnostic Nasal Endoscopy Pre-procedure diagnosis: Nasal obstruction, nasal septal deviation Post-procedure diagnosis: same Indication: See pre-procedure diagnosis and physical exam above Complications: None apparent EBL: 0 mL Anesthesia: Lidocaine  4% and topical decongestant was topically sprayed in each nasal cavity  Description of Procedure:  Patient was identified. A rigid 30 degree endoscope was utilized to evaluate the sinonasal cavities, mucosa, sinus ostia and turbinates and septum.  Overall, signs of mucosal inflammation are not noted. Noted modest right septal deviation with bilateral inferior turbinate hypertrophy.  No mucopurulence, polyps, or masses noted.   Right Middle  meatus: clear Right SE Recess: clear Left MM: clear Left SE Recess: clear   CPT CODE -- 31231 - Mod 25   ASSESSMENT:  38 y.o. with:  1. Nasal septal deviation   2. Nasal congestion   3. Hypertrophy of both inferior nasal turbinates   4. Nasal obstruction   5. OSA (obstructive sleep apnea)    Failed medical management prior and has tried breathe right strips without benefit. She has a structural cause that is contributing - nasal septal deviation and turbinate hypertrophy  We discussed the goals of septoplasty and turbinate reduction, and expectations for postoperative management. Will plan to leave splints in place, and removal was also discussed. We also discussed nasal obstruction post-operatively until splints in place and pain management.  We discussed R/B/A including pain, infection, bleeding (<3% risk of operative visit for control), persistent symptoms, need for revision surgery, and other risks including damage to surrounding structures, septal perforation (<1%), and injury to skull base with risk of CSF leak (<1%), anesthetic complications, among others.  We discussed use of nasal  saline spray and nasal saline irrigations post-operatively We also discussed use of intranasal steroid post-operatively until healing occurs Patient understands and is ready to proceed.  OSA: Mild, continue oral appliance  See below regarding exact medications prescribed this encounter including dosages and route: No orders of the defined types were placed in this encounter.    Thank you for allowing me the opportunity to care for your patient. Please do not hesitate to contact me should you have any other questions.  Sincerely, Eldora Blanch, MD Otolaryngologist (ENT), [facility] Phone: [phone] Fax: [phone]  MDM:  Level 4: [id number] Complexity/Problems addressed: mod - chronic problems Data complexity: mod - independent interpretation of tests, review of  notes,labs - Morbidity: mod - decision for surgery  - Prescription Drug prescribed or managed: no  12/09/2023, 12:03 PM

## 2023-12-10 DIAGNOSIS — F321 Major depressive disorder, single episode, moderate: Secondary | ICD-10-CM | POA: Diagnosis not present

## 2023-12-24 DIAGNOSIS — F321 Major depressive disorder, single episode, moderate: Secondary | ICD-10-CM | POA: Diagnosis not present

## 2023-12-29 ENCOUNTER — Other Ambulatory Visit: Payer: Self-pay

## 2023-12-29 ENCOUNTER — Encounter (HOSPITAL_BASED_OUTPATIENT_CLINIC_OR_DEPARTMENT_OTHER): Payer: Self-pay | Admitting: *Deleted

## 2024-01-05 ENCOUNTER — Ambulatory Visit (HOSPITAL_BASED_OUTPATIENT_CLINIC_OR_DEPARTMENT_OTHER)
Admission: RE | Admit: 2024-01-05 | Discharge: 2024-01-05 | Disposition: A | Discharge status: Home / Self Care | Attending: Otolaryngology | Admitting: Otolaryngology

## 2024-01-05 ENCOUNTER — Encounter (HOSPITAL_BASED_OUTPATIENT_CLINIC_OR_DEPARTMENT_OTHER): Payer: Self-pay

## 2024-01-05 ENCOUNTER — Encounter (HOSPITAL_BASED_OUTPATIENT_CLINIC_OR_DEPARTMENT_OTHER)
Admission: RE | Disposition: A | Discharge status: Home / Self Care | Payer: Self-pay | Source: Home / Self Care | Attending: Otolaryngology

## 2024-01-05 ENCOUNTER — Ambulatory Visit (HOSPITAL_BASED_OUTPATIENT_CLINIC_OR_DEPARTMENT_OTHER): Payer: Self-pay | Admitting: Anesthesiology

## 2024-01-05 ENCOUNTER — Other Ambulatory Visit: Payer: Self-pay

## 2024-01-05 DIAGNOSIS — J342 Deviated nasal septum: Secondary | ICD-10-CM | POA: Insufficient documentation

## 2024-01-05 DIAGNOSIS — R0981 Nasal congestion: Secondary | ICD-10-CM | POA: Insufficient documentation

## 2024-01-05 DIAGNOSIS — J45909 Unspecified asthma, uncomplicated: Secondary | ICD-10-CM | POA: Insufficient documentation

## 2024-01-05 DIAGNOSIS — G4730 Sleep apnea, unspecified: Secondary | ICD-10-CM | POA: Diagnosis not present

## 2024-01-05 DIAGNOSIS — J343 Hypertrophy of nasal turbinates: Secondary | ICD-10-CM | POA: Diagnosis not present

## 2024-01-05 DIAGNOSIS — K219 Gastro-esophageal reflux disease without esophagitis: Secondary | ICD-10-CM | POA: Insufficient documentation

## 2024-01-05 DIAGNOSIS — M1990 Unspecified osteoarthritis, unspecified site: Secondary | ICD-10-CM | POA: Diagnosis not present

## 2024-01-05 DIAGNOSIS — Z01818 Encounter for other preprocedural examination: Secondary | ICD-10-CM

## 2024-01-05 HISTORY — PX: NASAL SEPTOPLASTY W/ TURBINOPLASTY: SHX2070

## 2024-01-05 HISTORY — DX: Sleep apnea, unspecified: G47.30

## 2024-01-05 LAB — POCT PREGNANCY, URINE: Preg Test, Ur: NEGATIVE

## 2024-01-05 SURGERY — SEPTOPLASTY, NOSE, WITH NASAL TURBINATE REDUCTION
Anesthesia: General | Laterality: Bilateral

## 2024-01-05 MED ORDER — EPHEDRINE 5 MG/ML INJ
INTRAVENOUS | Status: AC
  Filled 2024-01-05: qty 10

## 2024-01-05 MED ORDER — PHENYLEPHRINE 80 MCG/ML (10ML) SYRINGE FOR IV PUSH (FOR BLOOD PRESSURE SUPPORT)
PREFILLED_SYRINGE | INTRAVENOUS | Status: DC | PRN
  Administered 2024-01-05: 80 ug via INTRAVENOUS
  Administered 2024-01-05: 160 ug via INTRAVENOUS
  Administered 2024-01-05: 80 ug via INTRAVENOUS
  Administered 2024-01-05: 160 ug via INTRAVENOUS

## 2024-01-05 MED ORDER — ONDANSETRON HCL 4 MG/2ML IJ SOLN
INTRAMUSCULAR | Status: DC | PRN
Start: 2024-01-05 — End: 2024-01-05
  Administered 2024-01-05: 4 mg via INTRAVENOUS

## 2024-01-05 MED ORDER — GLYCOPYRROLATE PF 0.2 MG/ML IJ SOSY
PREFILLED_SYRINGE | INTRAMUSCULAR | Status: DC | PRN
Start: 2024-01-05 — End: 2024-01-05
  Administered 2024-01-05: .2 mg via INTRAVENOUS

## 2024-01-05 MED ORDER — EPINEPHRINE HCL (NASAL) 0.1 % NA SOLN
NASAL | Status: DC | PRN
  Administered 2024-01-05: 1 [drp] via TOPICAL

## 2024-01-05 MED ORDER — FENTANYL CITRATE (PF) 100 MCG/2ML IJ SOLN
INTRAMUSCULAR | Status: DC | PRN
  Administered 2024-01-05: 100 ug via INTRAVENOUS

## 2024-01-05 MED ORDER — FENTANYL CITRATE (PF) 100 MCG/2ML IJ SOLN
INTRAMUSCULAR | Status: AC
  Filled 2024-01-05: qty 2

## 2024-01-05 MED ORDER — LIDOCAINE-EPINEPHRINE 1 %-1:100000 IJ SOLN
INTRAMUSCULAR | Status: DC | PRN
  Administered 2024-01-05: 13 mL

## 2024-01-05 MED ORDER — ONDANSETRON HCL 4 MG/2ML IJ SOLN
INTRAMUSCULAR | Status: AC
Start: 2024-01-05 — End: 2024-01-05
  Filled 2024-01-05: qty 2

## 2024-01-05 MED ORDER — DEXMEDETOMIDINE HCL IN NACL 80 MCG/20ML IV SOLN
INTRAVENOUS | Status: AC
Start: 2024-01-05 — End: 2024-01-05
  Filled 2024-01-05: qty 20

## 2024-01-05 MED ORDER — PHENYLEPHRINE 80 MCG/ML (10ML) SYRINGE FOR IV PUSH (FOR BLOOD PRESSURE SUPPORT)
PREFILLED_SYRINGE | INTRAVENOUS | Status: AC
Start: 2024-01-05 — End: 2024-01-05
  Filled 2024-01-05: qty 10

## 2024-01-05 MED ORDER — ROCURONIUM BROMIDE 10 MG/ML (PF) SYRINGE
PREFILLED_SYRINGE | INTRAVENOUS | Status: DC | PRN
  Administered 2024-01-05: 5 mg via INTRAVENOUS
  Administered 2024-01-05: 50 mg via INTRAVENOUS

## 2024-01-05 MED ORDER — FLUORESCEIN SODIUM 1 MG OP STRP
ORAL_STRIP | OPHTHALMIC | Status: DC | PRN
  Administered 2024-01-05: 1

## 2024-01-05 MED ORDER — CEFAZOLIN SODIUM-DEXTROSE 2-3 GM-%(50ML) IV SOLR
INTRAVENOUS | Status: DC | PRN
  Administered 2024-01-05: 2 g via INTRAVENOUS

## 2024-01-05 MED ORDER — SODIUM CHLORIDE 0.9 % IV SOLN
INTRAVENOUS | Status: AC | PRN
  Administered 2024-01-05: 100 mL

## 2024-01-05 MED ORDER — 0.9 % SODIUM CHLORIDE (POUR BTL) OPTIME
TOPICAL | Status: DC | PRN
  Administered 2024-01-05: 120 mL

## 2024-01-05 MED ORDER — OXYCODONE HCL 5 MG PO TABS: 5.0000 mg | ORAL_TABLET | Freq: Once | ORAL | Status: DC | PRN

## 2024-01-05 MED ORDER — ACETAMINOPHEN 500 MG PO TABS: 1000.0000 mg | ORAL_TABLET | Freq: Four times a day (QID) | ORAL | 2 refills | Status: AC | PRN

## 2024-01-05 MED ORDER — DEXAMETHASONE SODIUM PHOSPHATE 10 MG/ML IJ SOLN
INTRAMUSCULAR | Status: AC
  Filled 2024-01-05: qty 1

## 2024-01-05 MED ORDER — LIDOCAINE 2% (20 MG/ML) 5 ML SYRINGE
INTRAMUSCULAR | Status: DC | PRN
Start: 2024-01-05 — End: 2024-01-05
  Administered 2024-01-05: 100 mg via INTRAVENOUS

## 2024-01-05 MED ORDER — MEPERIDINE HCL 25 MG/ML IJ SOLN: 6.2500 mg | INTRAMUSCULAR | Status: DC | PRN

## 2024-01-05 MED ORDER — FENTANYL CITRATE (PF) 100 MCG/2ML IJ SOLN: 25.0000 ug | INTRAMUSCULAR | Status: DC | PRN

## 2024-01-05 MED ORDER — MIDAZOLAM HCL 2 MG/2ML IJ SOLN
INTRAMUSCULAR | Status: AC
  Filled 2024-01-05: qty 2

## 2024-01-05 MED ORDER — IBUPROFEN 200 MG PO TABS
400.0000 mg | ORAL_TABLET | Freq: Four times a day (QID) | ORAL | 1 refills | Status: AC | PRN
Start: 2024-01-05 — End: 2024-01-12

## 2024-01-05 MED ORDER — LIDOCAINE 2% (20 MG/ML) 5 ML SYRINGE
INTRAMUSCULAR | Status: AC
Start: 2024-01-05 — End: 2024-01-05
  Filled 2024-01-05: qty 10

## 2024-01-05 MED ORDER — PROPOFOL 10 MG/ML IV BOLUS
INTRAVENOUS | Status: DC | PRN
  Administered 2024-01-05: 150 mg via INTRAVENOUS

## 2024-01-05 MED ORDER — SALINE SPRAY 0.65 % NA SOLN
2.0000 | Freq: Four times a day (QID) | NASAL | 1 refills | Status: AC | PRN
Start: 2024-01-05 — End: 2024-02-15

## 2024-01-05 MED ORDER — OXYCODONE HCL 5 MG PO TABS: 5.0000 mg | ORAL_TABLET | Freq: Three times a day (TID) | ORAL | 0 refills | Status: AC | PRN

## 2024-01-05 MED ORDER — MIDAZOLAM HCL 5 MG/5ML IJ SOLN
INTRAMUSCULAR | Status: DC | PRN
  Administered 2024-01-05: 2 mg via INTRAVENOUS

## 2024-01-05 MED ORDER — SUGAMMADEX SODIUM 200 MG/2ML IV SOLN
INTRAVENOUS | Status: DC | PRN
Start: 2024-01-05 — End: 2024-01-05
  Administered 2024-01-05: 200 mg via INTRAVENOUS

## 2024-01-05 MED ORDER — LACTATED RINGERS IV SOLN: INTRAVENOUS | Status: DC

## 2024-01-05 MED ORDER — ONDANSETRON HCL 4 MG/2ML IJ SOLN: 4.0000 mg | Freq: Once | INTRAMUSCULAR | Status: DC | PRN

## 2024-01-05 MED ORDER — SUCCINYLCHOLINE CHLORIDE 200 MG/10ML IV SOSY
PREFILLED_SYRINGE | INTRAVENOUS | Status: AC
Start: 2024-01-05 — End: 2024-01-05
  Filled 2024-01-05: qty 10

## 2024-01-05 MED ORDER — OXYCODONE HCL 5 MG/5ML PO SOLN: 5.0000 mg | Freq: Once | ORAL | Status: DC | PRN

## 2024-01-05 MED ORDER — OXYMETAZOLINE HCL 0.05 % NA SOLN
2.0000 | Freq: Three times a day (TID) | NASAL | 0 refills | Status: AC | PRN
Start: 2024-01-05 — End: 2024-01-08

## 2024-01-05 MED ORDER — MUPIROCIN 2 % EX OINT
TOPICAL_OINTMENT | CUTANEOUS | Status: DC | PRN
  Administered 2024-01-05: 1 via NASAL

## 2024-01-05 MED ORDER — EPHEDRINE SULFATE-NACL 50-0.9 MG/10ML-% IV SOSY
PREFILLED_SYRINGE | INTRAVENOUS | Status: DC | PRN
  Administered 2024-01-05 (×3): 5 mg via INTRAVENOUS

## 2024-01-05 SURGICAL SUPPLY — 39 items
BLADE INF TURB ROT M4 2 5PK (BLADE) IMPLANT
BLADE SHAVER TURBINATE 11X2.9 (BLADE) ×1 IMPLANT
BLADE TRICUT ROTATE M4 4 5PK (BLADE) IMPLANT
CANISTER SUCT 1200ML W/VALVE (MISCELLANEOUS) ×2 IMPLANT
COAGULATOR SUCT 8FR VV (MISCELLANEOUS) IMPLANT
DEFOGGER MIRROR 1QT (MISCELLANEOUS) ×1 IMPLANT
DRSG NASOPORE 8CM (GAUZE/BANDAGES/DRESSINGS) IMPLANT
DRSG TELFA 3X8 NADH STRL (GAUZE/BANDAGES/DRESSINGS) IMPLANT
ELECTRODE REM PT RTRN 9FT ADLT (ELECTROSURGICAL) ×1 IMPLANT
GAUZE SPONGE 2X2 STRL 8-PLY (GAUZE/BANDAGES/DRESSINGS) ×1 IMPLANT
GLOVE BIO SURGEON STRL SZ 6.5 (GLOVE) ×1 IMPLANT
GOWN STRL REUS W/ TWL LRG LVL3 (GOWN DISPOSABLE) ×2 IMPLANT
HEMOSTAT SURGICEL .5X2 ABSORB (HEMOSTASIS) IMPLANT
HEMOSTAT SURGICEL 2X14 (HEMOSTASIS) IMPLANT
IV NS 500ML BAXH (IV SOLUTION) ×1 IMPLANT
NDL HYPO 25X1 1.5 SAFETY (NEEDLE) ×2 IMPLANT
NDL SPNL 25GX3.5 QUINCKE BL (NEEDLE) IMPLANT
NEEDLE HYPO 25X1 1.5 SAFETY (NEEDLE) ×1 IMPLANT
NEEDLE SPNL 25GX3.5 QUINCKE BL (NEEDLE) IMPLANT
NS IRRIG 1000ML POUR BTL (IV SOLUTION) ×1 IMPLANT
PACK BASIN DAY SURGERY FS (CUSTOM PROCEDURE TRAY) ×1 IMPLANT
PACK ENT DAY SURGERY (CUSTOM PROCEDURE TRAY) ×1 IMPLANT
PATTIES SURGICAL .5 X3 (DISPOSABLE) ×1 IMPLANT
SHEATH ENDOSCRUB 0 DEG (SHEATH) IMPLANT
SHEATH ENDOSCRUB 30 DEG (SHEATH) IMPLANT
SHEET SILICONE 2X3 0.03 REINF (MISCELLANEOUS) IMPLANT
SLEEVE SCD COMPRESS KNEE MED (STOCKING) ×1 IMPLANT
SOLUTION ANTFG W/FOAM PAD STRL (MISCELLANEOUS) ×1 IMPLANT
SPIKE FLUID TRANSFER (MISCELLANEOUS) IMPLANT
SPLINT NASAL AIRWAY SILICONE (MISCELLANEOUS) ×1 IMPLANT
SPLINT NASAL POSISEP X .6X2 (GAUZE/BANDAGES/DRESSINGS) IMPLANT
SUCTION TUBE FRAZIER 10FR DISP (SUCTIONS) ×1 IMPLANT
SUT CHROMIC 4 0 RB 1X27 (SUTURE) IMPLANT
SUT PROLENE 3 0 PS 2 (SUTURE) ×1 IMPLANT
SUT VICRYL RAPIDE 4/0 PS 2 (SUTURE) ×1 IMPLANT
SYR 10ML LL (SYRINGE) ×1 IMPLANT
TOWEL GREEN STERILE FF (TOWEL DISPOSABLE) ×1 IMPLANT
TUBE CONNECTING 20X1/4 (TUBING) ×1 IMPLANT
YANKAUER SUCT BULB TIP NO VENT (SUCTIONS) ×1 IMPLANT

## 2024-01-05 NOTE — Anesthesia Procedure Notes (Signed)
 Procedure Name: Intubation Date/Time: 01/05/2024 1:10 PM  Performed by: Leotha Andrez DEL, CRNAPre-anesthesia Checklist: Emergency Drugs available, Patient identified, Suction available, Patient being monitored and Timeout performed Patient Re-evaluated:Patient Re-evaluated prior to induction Oxygen Delivery Method: Circle system utilized Preoxygenation: Pre-oxygenation with 100% oxygen Induction Type: IV induction Ventilation: Mask ventilation without difficulty Laryngoscope Size: Mac and 3 Grade View: Grade I Tube type: Oral Tube size: 7.0 mm Number of attempts: 1 Airway Equipment and Method: Stylet Secured at: 22 cm Tube secured with: Tape Dental Injury: Teeth and Oropharynx as per pre-operative assessment

## 2024-01-05 NOTE — Op Note (Signed)
 Otolaryngology Operative note  Donna Spencer Date/Time of Admission: 01/05/2024 11:24 AM  CSN: [id number];MRN:[id number]  DOB: 21-Jun-1985 Age: 38 y.o. Location: [facility]    Pre-Op Diagnosis: Deviated nasal septum Hypertrophy of nasal turbinates - bilateral Nasal Congestion Nasal Septal Deviation  Post-Op Diagnosis: Same  Procedure: Endoscopic-assisted septoplasty (CPT 69479) Bilateral nasal endoscopy with submucous resection of bilateral inferior turbinates with outfracture (CPT 69859-49,48)  Surgeon: Eldora Blanch, MD  Anesthesia type:  General  Anesthesiologist: Anesthesiologist: Mallory Manus, MD CRNA: Leotha Andrez DEL, CRNA; Donnell Berwyn SQUIBB, CRNA   Staff: Circulator: Johnson-Hines, Arland HERO, RN Scrub Person: Alto Charmaine MARIE Eliberto, Geroge HERO, RN  Implants: Emerick Splints - Bilateral  Specimens: None  EBL: 25cc  Drains: None  Post-op disposition and condition: PACU, hemodynamically stable  Findings: Right septal deviation with bilateral inferior turbinate hypertrophy; nasal airway patency improved after procedure  Complications: None apparent  Indications and consent:  Donna Spencer is a 38 y.o. female with diagnoses above. The patient's options were discussed, including risks/benefits/alternatives for each option. Patient expressed understanding, and despite these risks, consented and decided to proceed with above procedures. Informed consent was signed before proceeding.  Procedure: Anesthesia and Prep -- After being properly identified in the preoperative holding area, the patient was brought into the operating suite. She was placed supine on the operating table. A pre-procedural time-out was performed. Pre-operative antibiotics and steroids were administered. After induction of general anesthesia, the patient was successfully intubated with confirmation of tube placement via CO2 return. Eyes were taped closed. Pledgets soaked in  1:1000 fluorescein  dyed epinephrine  were placed in each nostril. The face was prepped and draped in usual fashion for endoscopic surgery  Endoscopic-assisted septoplasty  -- The nose was decongested with epinephrine  1:1000 soaked pledgets. The septum was injected bilaterally with 1% lidocaine  with 1:100,000 epinephrine . A 15 blade was utilized to make a hemi-transfixion incision in the left nasal cavity. A Cottle elevator was then used to elevate a sub-mucoperichondrial flap. Once adequate room was available, the 0 degree endoscopic was placed into the flap and the septal cartilage was visualized. The suction freer was used to raise the flap back to the bony-cartilaginous junction. A caudal transcartilaginous cut was made using the Cottle elevator taking care to leave at least a 1cm caudal strut. The opposing mucoperichondrial flap was then elevated off the cartilage and bone of the right side. Then, a Rudean forcep was used to cut away the cartilage working anterior to posterior. As the cartilage was removed superiorly, care was taken to leave at least a 1cm dorsal strut.  A Jansen-Middleton double-action rongeur was used to remove the bony aspect of the deviated septum.  During elevation, there was a small tear in the right mucoperichondrial flap overlying the previous deviation.  There was no opposing defect created.  Bony maxillary crest was identified and partially removed with a Rudean forcep. The nose was then inspected and the septum was seen to be midline. The previous nasal obstruction was much improved. At the conclusion of the case, the hemitransfixion incision was closed with interrupted 4-0 vicryl rapide suture and doyle splints coated in mupirocin  were placed bilaterally and sutured to the anterior septum with a 3-0 prolene suture.   Bilateral submucous inferior turbinate reduction with microdebrider and outfracture -- 1% lidocaine  with 1:100,000 units of epinephrine  was injected into the  bilateral inferior turbinates. A 15 blade was used to make an incision in the head of the left inferior turbinate. A cottle elevator was  then used to elevate a submucosal flap along the medial surface of the turbinate. A microdebrider turbinate blade was inserted into the left turbinate and pieces of bone and soft tissue of the turbinate were resected in a submucosal fashion. Particular attention was paid to the head of the turbinate to improve nasal valve airflow. Once adequate resection was achieved, a Engineering geologist was used to out-fracture the inferior turbinate. This procedure was then repeated on the right side to complete the bilateral submucosal inferior turbinate resection. The nasal airway was then seen to be widely patent.  Conclusion -- Both sides were inspected and no orbital fat or evidence of cerebral spinal fluid leak were observed. Both sides were irrigated and clot removed. A flexible suction catheter was used to remove fluid and blood from the oropharynx and hypopharynx.   Tape was removed from the eyelids and the eyes were checked for tension or proptosis, none was observed. The patient's skin was cleaned. She was returned to the care of the anesthesia team. She was then weaned from the anesthetic and transported to the PACU in stable condition.  Donna Spencer

## 2024-01-05 NOTE — Anesthesia Postprocedure Evaluation (Signed)
 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(s) Performed: SEPTOPLASTY, NOSE, WITH NASAL TURBINATE REDUCTION (Bilateral)     Patient location during evaluation: PACU Anesthesia Type: General Level of consciousness: awake and alert Pain management: pain level controlled Vital Signs Assessment: post-procedure vital signs reviewed and stable Respiratory status: spontaneous breathing, nonlabored ventilation, respiratory function stable and patient connected to nasal cannula oxygen Cardiovascular status: blood pressure returned to baseline and stable Postop Assessment: no apparent nausea or vomiting Anesthetic complications: no   No notable events documented.  Last Vitals:  Vitals:   01/05/24 1515 01/05/24 1524  BP: 121/74 120/73  Pulse: (!) 109 (!) 104  Resp: 19 20  Temp:  (!) 36.2 C  SpO2: 92% 93%    Last Pain:  Vitals:   01/05/24 1524  TempSrc: Temporal  PainSc: 0-No pain                 Toluwani Yadav

## 2024-01-05 NOTE — H&P (Signed)
 Pre-Operative H&P - Day Of Surgery Patient Name: Donna Spencer Date:   01/05/2024  HPI: Donna Spencer is a 38 y.o. female who presents today for operative treatment of nasal obstruction, nasal septal deviation, bilateral inferior turbinate hypertrophy. Patient denies recent significant changes to health or significant new medications or physiologic change in condition which would immediately impact plans. No new types of therapy has been initiated that would change the plan or the appropriateness of the plan.   ROS:  A complete review of systems was obtained and is otherwise negative.   PMH:  Past Medical History:  Diagnosis Date   Allergy    Anal fissure    Anemia    Anxiety    Arthritis    Asthma    Benign hematuria    GERD (gastroesophageal reflux disease)    Mouth ulcers    recurrent, apthous   Sleep apnea     PSH:  Past Surgical History:  Procedure Laterality Date   CESAREAN SECTION N/A 06/26/2020   Procedure: CESAREAN SECTION;  Surgeon: Lequita Evalene LABOR, MD;  Location: [facility];  Service: Obstetrics;  Laterality: N/A;   REPAIR ANKLE LIGAMENT Right 2002    MEDS:   Current Facility-Administered Medications:    lactated ringers  infusion, , Intravenous, Continuous, Hollis, Kevin D, MD  ALLERGIES: Biaxin [clarithromycin], Flagyl  [metronidazole ], Milk-related compounds, and Oseltamivir phosphate  EXAM: Vitals: BP 125/76   Pulse 76   Temp 98.4 F (36.9 C) (Temporal)   Resp 16   Ht 4' 11 (1.499 m)   Wt 96.4 kg   LMP 01/05/2024 (Exact Date)   SpO2 98%   BMI 42.92 kg/m   General Awake, at baseline alertness.   HEENT No scleral icterus or conjunctival hemorrhage. Globe position appears normal. External ears  normal. Nose patent without rhinorrhea. No lymphadenopathy. No thyromegaly  Cardiovascular No cyanosis.  Pulmonary No audible stridor. Breathing easily with no labor.  Neuro Symmetric facial movement.   Psychiatry Appropriate affect and mood.  Skin No scars or  lesions on face or neck.  Extermities Moves all extremities with normal range of motion.   Other Findings None.   Assessment & Plan: Donna Spencer has diagnoses of nasal septal deviation, bilateral inferior turbinate hypertrophy and will go to the OR today for septoplasty, bilateral inferior turbinate reduction. Informed consent was obtained and available in EMR today. All questions have been answered, and risks/benefits/alternatives of procedure as noted in the consent were discussed in a quiet area. Questions were invited and answered. The patient expressed understanding, provided consent and wished to proceed despite risks.  Donna Spencer 01/05/2024 12:19 PM

## 2024-01-05 NOTE — Anesthesia Preprocedure Evaluation (Addendum)
 Anesthesia Evaluation  Patient identified by MRN, date of birth, ID band Patient awake    Reviewed: Allergy & Precautions, H&P , NPO status , Patient's Chart, lab work & pertinent test results  Airway Mallampati: II  TM Distance: >3 FB Neck ROM: Full    Dental no notable dental hx.    Pulmonary neg pulmonary ROS, asthma , sleep apnea    Pulmonary exam normal breath sounds clear to auscultation       Cardiovascular Exercise Tolerance: Good negative cardio ROS Normal cardiovascular exam Rhythm:Regular Rate:Normal     Neuro/Psych   Anxiety     negative neurological ROS  negative psych ROS   GI/Hepatic negative GI ROS, Neg liver ROS,GERD  ,,  Endo/Other  negative endocrine ROS    Renal/GU negative Renal ROS  negative genitourinary   Musculoskeletal negative musculoskeletal ROS (+) Arthritis ,    Abdominal   Peds negative pediatric ROS (+)  Hematology negative hematology ROS (+) Blood dyscrasia, anemia   Anesthesia Other Findings   Reproductive/Obstetrics negative OB ROS                              Anesthesia Physical Anesthesia Plan  ASA: 2  Anesthesia Plan: General   Post-op Pain Management: Tylenol  PO (pre-op)* and Celebrex PO (pre-op)*   Induction: Intravenous  PONV Risk Score and Plan: 3 and Ondansetron , Dexamethasone  and Treatment may vary due to age or medical condition  Airway Management Planned: Oral ETT and LMA  Additional Equipment: None  Intra-op Plan:   Post-operative Plan: Extubation in OR  Informed Consent: I have reviewed the patients History and Physical, chart, labs and discussed the procedure including the risks, benefits and alternatives for the proposed anesthesia with the patient or authorized representative who has indicated his/her understanding and acceptance.       Plan Discussed with: Anesthesiologist and CRNA  Anesthesia Plan Comments: (  )          Anesthesia Quick Evaluation

## 2024-01-05 NOTE — Progress Notes (Signed)
 Exegen machine/ US  uses daily on left ankle due to fracture in December. Other right foot has a bad sprain prior and healing. Fall risk band given.

## 2024-01-05 NOTE — Discharge Instructions (Addendum)
 Surgery Discharge Instructions (Dr. Tobie)  1. Call your doctor or go to the emergency room if you have: - Fever of 101.5 degrees or higher - Severe pain that has increased greatly since your surgery or is uncontrolled by your current pain medications - Increasing or concerning amount of bleeding from your nose. You should expect bloody drainage from your nose for 1-3 days. Even a 5-10 minute trickle nose bleed is expected after surgery - Nausea and vomiting that does not go away - Chest pain/shortness of breath - Any other acute events, problems, or concerns  2. Wound Care/Dressings/Drain Instructions:  - It is OK to shower following your surgery. - You should begin using nasal saline spray once you no longer have bleeding from the nose. This is usually 1-2 days after your surgery. -There are small soft plastic splints in your nose to hold your septum in the middle. These may crust a bit, which can be reduced by using nasal saline spray.  - For bleeding from nose, you can use afrin nasal spray 2-3 sprays each nostril 3 times per day which will slow down the bleeding. Do not use for more than 3 days.   3. Follow Up:  - A follow up appointment will be scheduled for you with Dr. Tobie. If you do not know the date/time, please contact our office  4. Activity/Restrictions: - Resume your regular activities, as tolerated - Avoid heavy lifting, bending over, manipulating your nose, blowing your nose, sneezing with your mouth closed, straining and strenuous activities until instructed otherwise. Do not lift more than 10 lbs for 7 days  5. Diet: - Resume your regular diet, as tolerated  6. Additional Instructions: - Use acetaminophen /Tylenol  (1000mg  every 6 hours as needed) and ibuprofen  (400mg  every 6 hours as needed) to control your pain. If this does not bring you relief or the pain remains severe, a stronger pain medication (Oxycodone  - 5mg  tablet every 6 hours as needed) has been prescribed  to you. - DO NOT MIX NARCOTIC PAIN MEDICATIONS OR TAKE NARCOTIC PRESCRIPTIONS AT THE SAME TIME (PERCODET, LORTAB, ROXICODONE , ETC.) - DO NOT DRIVE OR OPERATE HEAVY MACHINERY WHILE ON NARCOTICS - DO NOT TAKE MORE THAN 4 GRAMS (4000mg ) OF TYLENOL  (ACETAMINOPHEN ) IN 24 HOURS  ________________________________________________________________  Department of Otolaryngology Contact Info: Otolaryngology Nursing Triage (Monday-Friday daytime working hours or for emergencies after hours) [phone]    Post Anesthesia Home Care Instructions  Activity: Get plenty of rest for the remainder of the day. A responsible individual must stay with you for 24 hours following the procedure.  For the next 24 hours, DO NOT: -Drive a car -Advertising copywriter -Drink alcoholic beverages -Take any medication unless instructed by your physician -Make any legal decisions or sign important papers.  Meals: Start with liquid foods such as gelatin or soup. Progress to regular foods as tolerated. Avoid greasy, spicy, heavy foods. If nausea and/or vomiting occur, drink only clear liquids until the nausea and/or vomiting subsides. Call your physician if vomiting continues.  Special Instructions/Symptoms: Your throat may feel dry or sore from the anesthesia or the breathing tube placed in your throat during surgery. If this causes discomfort, gargle with warm salt water. The discomfort should disappear within 24 hours.  If you had a scopolamine  patch placed behind your ear for the management of post- operative nausea and/or vomiting:  1. The medication in the patch is effective for 72 hours, after which it should be removed.  Wrap patch in a tissue and  discard in the trash. Wash hands thoroughly with soap and water. 2. You may remove the patch earlier than 72 hours if you experience unpleasant side effects which may include dry mouth, dizziness or visual disturbances. 3. Avoid touching the patch. Wash your hands with soap  and water after contact with the patch.

## 2024-01-05 NOTE — Transfer of Care (Signed)
 Immediate Anesthesia Transfer of Care Note  Patient: Donna Spencer  Procedure(s) Performed: SEPTOPLASTY, NOSE, WITH NASAL TURBINATE REDUCTION (Bilateral)  Patient Location: PACU  Anesthesia Type:General  Level of Consciousness: awake, alert , and patient cooperative  Airway & Oxygen Therapy: Patient Spontanous Breathing and Patient connected to face mask oxygen  Post-op Assessment: Report given to RN and Post -op Vital signs reviewed and stable  Post vital signs: Reviewed and stable  Last Vitals:  Vitals Value Taken Time  BP 126/68 01/05/24 14:47  Temp 36.9 C 01/05/24 14:47  Pulse 113 01/05/24 14:50  Resp 17 01/05/24 14:50  SpO2 100 % 01/05/24 14:50  Vitals shown include unfiled device data.  Last Pain:  Vitals:   01/05/24 1447  TempSrc:   PainSc: 0-No pain         Complications: No notable events documented.

## 2024-01-06 ENCOUNTER — Encounter (HOSPITAL_BASED_OUTPATIENT_CLINIC_OR_DEPARTMENT_OTHER): Payer: Self-pay | Admitting: Otolaryngology

## 2024-01-06 ENCOUNTER — Telehealth (INDEPENDENT_AMBULATORY_CARE_PROVIDER_SITE_OTHER): Payer: Self-pay

## 2024-01-06 NOTE — Telephone Encounter (Signed)
 done

## 2024-01-10 ENCOUNTER — Ambulatory Visit (INDEPENDENT_AMBULATORY_CARE_PROVIDER_SITE_OTHER): Admitting: Otolaryngology

## 2024-01-10 ENCOUNTER — Encounter (INDEPENDENT_AMBULATORY_CARE_PROVIDER_SITE_OTHER): Payer: Self-pay | Admitting: Otolaryngology

## 2024-01-10 VITALS — BP 117/77 | HR 87

## 2024-01-10 DIAGNOSIS — J343 Hypertrophy of nasal turbinates: Secondary | ICD-10-CM

## 2024-01-10 DIAGNOSIS — Z9889 Other specified postprocedural states: Secondary | ICD-10-CM

## 2024-01-10 DIAGNOSIS — J3489 Other specified disorders of nose and nasal sinuses: Secondary | ICD-10-CM

## 2024-01-10 DIAGNOSIS — J342 Deviated nasal septum: Secondary | ICD-10-CM

## 2024-01-10 DIAGNOSIS — R0981 Nasal congestion: Secondary | ICD-10-CM

## 2024-01-10 NOTE — Progress Notes (Signed)
 S/p Setoplasty/turbinate reduction on 01/05/2024 S: Doing well, nasal breathing is much improved after split removal. No pain, no fevers, no bleeding.  O: Doyle splints in place, removed; after removal, no septal hematoma noted; Able to visualize axilla of MT bilaterally; tubinates reduced; no epistaxis. No evidence ofinfection A/P: 38 y.o. 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 Recovering appropriately Recommend daily nasal rinses; BID Flonase  F/u 1 month

## 2024-01-10 NOTE — Patient Instructions (Signed)
  Use two sprays of flonase  in each nostril twice per day; you can use saline spray as much as you'd like - will speed up healing process and keep you your nose from crusting  [facility] Nasal Saline Rinse - use daily - start nasal saline rinses with [facility]Med Bottle available over the counter    Nasal Saline Irrigation instructions: If you choose to make your own salt water solution, You will need: Salt (kosher, canning, or pickling salt) Baking soda Nasal irrigation bottle (i.e. Aurel[facility] Med Sinus Rinse) Measuring spoon ( teaspoon) Distilled / boiled water   Mix solution Mix 1 teaspoon of salt, 1/2 teaspoon of baking soda and 1 cup of water into irrigation bottle ** May use saline packet instead of homemade recipe for this step if you prefer If medicine was prescribed to be mixed with solution, place this into bottle Examples 2 inches of 2% mupirocin  ointment Budesonide solution Position your head: Lean over sink (about 45 degrees) Rotate head (about 45 degrees) so that one nostril is above the other Irrigate Insert tip of irrigation bottle into upper nostril so it forms a comfortable seal Irrigate while breathing through your mouth May remove the straw from the bottle in order to irrigate the entire solution (important if medicine was added) Exhale through nose when finished and blow nose as necessary  Repeat on opposite side with other 1/2 of solution (120 mL) or remake solution if all 240 mL was used on first side Wash irrigation bottle regularly, replace every 3 months

## 2024-01-13 ENCOUNTER — Telehealth (INDEPENDENT_AMBULATORY_CARE_PROVIDER_SITE_OTHER): Payer: Self-pay

## 2024-01-13 NOTE — Telephone Encounter (Signed)
 Pt called and is concerned since her surgical procedure she is having congestion and drainage coming out of her nose Pt states it is thick and yellow. Pt asked if there is anything she needs to be looking out for if it is an infection or a virus and what she needs to do.

## 2024-01-14 ENCOUNTER — Telehealth (INDEPENDENT_AMBULATORY_CARE_PROVIDER_SITE_OTHER): Payer: Self-pay

## 2024-01-14 NOTE — Telephone Encounter (Signed)
 Called Pt no answer Left VM

## 2024-01-14 NOTE — Telephone Encounter (Signed)
 Called Pt and let her know. She asked if she would be able to use her Donna Spencer peper to help get some of the stuff out?

## 2024-01-19 NOTE — Telephone Encounter (Signed)
See recent encounter.

## 2024-02-03 DIAGNOSIS — E782 Mixed hyperlipidemia: Secondary | ICD-10-CM | POA: Diagnosis not present

## 2024-02-03 DIAGNOSIS — E88819 Insulin resistance, unspecified: Secondary | ICD-10-CM | POA: Diagnosis not present

## 2024-02-03 DIAGNOSIS — E559 Vitamin D deficiency, unspecified: Secondary | ICD-10-CM | POA: Diagnosis not present

## 2024-02-03 DIAGNOSIS — G4733 Obstructive sleep apnea (adult) (pediatric): Secondary | ICD-10-CM | POA: Diagnosis not present

## 2024-02-15 ENCOUNTER — Ambulatory Visit (INDEPENDENT_AMBULATORY_CARE_PROVIDER_SITE_OTHER): Admitting: Otolaryngology

## 2024-02-15 VITALS — BP 132/78 | HR 77 | Temp 98.0°F | Ht 59.0 in | Wt 211.0 lb

## 2024-02-15 DIAGNOSIS — J3489 Other specified disorders of nose and nasal sinuses: Secondary | ICD-10-CM

## 2024-02-15 DIAGNOSIS — J342 Deviated nasal septum: Secondary | ICD-10-CM

## 2024-02-15 DIAGNOSIS — R0981 Nasal congestion: Secondary | ICD-10-CM

## 2024-02-15 DIAGNOSIS — J343 Hypertrophy of nasal turbinates: Secondary | ICD-10-CM

## 2024-02-15 DIAGNOSIS — Z9889 Other specified postprocedural states: Secondary | ICD-10-CM

## 2024-02-15 NOTE — Progress Notes (Signed)
 S/p Setoplasty/turbinate reduction on 01/05/2024 S: Doing well, nasal breathing is overall improved but due to allergy season, some regression reported. No pain, no fevers, no bleeding. Snoring persists O: no septal hematoma noted; Able to visualize axilla of MT bilaterally; tubinates reduced; no epistaxis. No evidence of infection; modest crusting removed A/P: 38 y.o. 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 Recovering appropriately Recommend daily nasal rinses and Flonase  for another 6 weeks; then can take during allergy season F/u PRN

## 2024-02-16 DIAGNOSIS — G4733 Obstructive sleep apnea (adult) (pediatric): Secondary | ICD-10-CM | POA: Diagnosis not present

## 2024-02-18 DIAGNOSIS — F321 Major depressive disorder, single episode, moderate: Secondary | ICD-10-CM | POA: Diagnosis not present

## 2024-02-28 DIAGNOSIS — F411 Generalized anxiety disorder: Secondary | ICD-10-CM | POA: Diagnosis not present

## 2024-02-28 DIAGNOSIS — F902 Attention-deficit hyperactivity disorder, combined type: Secondary | ICD-10-CM | POA: Diagnosis not present

## 2024-03-03 DIAGNOSIS — F321 Major depressive disorder, single episode, moderate: Secondary | ICD-10-CM | POA: Diagnosis not present

## 2024-03-06 DIAGNOSIS — Z23 Encounter for immunization: Secondary | ICD-10-CM | POA: Diagnosis not present

## 2024-03-13 DIAGNOSIS — F902 Attention-deficit hyperactivity disorder, combined type: Secondary | ICD-10-CM | POA: Diagnosis not present

## 2024-03-13 DIAGNOSIS — F411 Generalized anxiety disorder: Secondary | ICD-10-CM | POA: Diagnosis not present

## 2024-03-17 DIAGNOSIS — F321 Major depressive disorder, single episode, moderate: Secondary | ICD-10-CM | POA: Diagnosis not present
